# Patient Record
Sex: Male | Born: 1965 | Race: Asian | Hispanic: No | Marital: Married | Smoking: Never smoker
Health system: Southern US, Community
[De-identification: ages and names within clinical notes are randomized; demographics above are authoritative.]

## PROBLEM LIST (undated history)

## (undated) DIAGNOSIS — D61818 Other pancytopenia: Secondary | ICD-10-CM

## (undated) DIAGNOSIS — K7460 Unspecified cirrhosis of liver: Secondary | ICD-10-CM

## (undated) HISTORY — DX: Other pancytopenia: D61.818

---

## 2016-08-27 DIAGNOSIS — K4090 Unilateral inguinal hernia, without obstruction or gangrene, not specified as recurrent: Secondary | ICD-10-CM | POA: Diagnosis not present

## 2016-09-23 ENCOUNTER — Other Ambulatory Visit: Payer: Self-pay | Admitting: Surgery

## 2016-09-23 DIAGNOSIS — K4020 Bilateral inguinal hernia, without obstruction or gangrene, not specified as recurrent: Secondary | ICD-10-CM | POA: Diagnosis not present

## 2016-09-24 NOTE — Patient Instructions (Addendum)
Chad Ramos  09/24/2016   Your procedure is scheduled on: 09-29-16   Report to [facility] Main  Entrance Take [place]  elevators to 3rd floor to Short Stay Center at 5:30 AM.   Call this number if you have problems the morning of surgery [phone]    Remember: ONLY 1 PERSON MAY GO WITH YOU TO SHORT STAY TO GET  READY MORNING OF YOUR SURGERY.  Do not eat food or drink liquids :After Midnight.     Take these medicines the morning of surgery with A SIP OF WATER: None                                You may not have any metal on your body including hair pins and              piercings  Do not wear jewelry, make-up, lotions, powders or perfumes, deodorant             Men may shave face and neck.   Do not bring valuables to the hospital. [facility] IS NOT             RESPONSIBLE   FOR VALUABLES.  Contacts, dentures or bridgework may not be worn into surgery.      Patients discharged the day of surgery will not be allowed to drive home.  Name and phone number of your driver: Chad Ramos [phone]                Please read over the following fact sheets you were given: _____________________________________________________________________             [facility] - Preparing [facility] Before surgery, you can play an important role.  Because skin is not sterile, your skin needs to be as free of germs as possible.  You can reduce the number of germs on your skin by washing with CHG (chlorahexidine gluconate) soap before surgery.  CHG is an antiseptic cleaner which kills germs and bonds with the skin to continue killing germs even after washing. Please DO NOT use if you have an allergy to CHG or antibacterial soaps.  If your skin becomes reddened/irritated stop using the CHG and inform your nurse when you arrive at Short Stay. Do not shave (including legs and underarms) for at least 48 hours prior to the first CHG shower.  You may shave your face/neck. Please  follow these instructions carefully:  1.  Shower with CHG Soap the night before surgery and the  morning of Surgery.  2.  If you choose to wash your hair, wash your hair first as usual with your  normal  shampoo.  3.  After you shampoo, rinse your hair and body thoroughly to remove the  shampoo.                           4.  Use CHG as you would any other liquid soap.  You can apply chg directly  to the skin and wash                       Gently with a scrungie or clean washcloth.  5.  Apply the CHG Soap to your body ONLY FROM THE NECK DOWN.   Do not use on face/  open                           Wound or open sores. Avoid contact with eyes, ears mouth and genitals (private parts).                       Wash face,  Genitals (private parts) with your normal soap.             6.  Wash thoroughly, paying special attention to the area where your surgery  will be performed.  7.  Thoroughly rinse your body with warm water from the neck down.  8.  DO NOT shower/wash with your normal soap after using and rinsing off  the CHG Soap.                9.  Pat yourself dry with a clean towel.            10.  Wear clean pajamas.            11.  Place clean sheets on your bed the night of your first shower and do not  sleep with pets. Day of Surgery : Do not apply any lotions/deodorants the morning of surgery.  Please wear clean clothes to the hospital/surgery center.  FAILURE TO FOLLOW THESE INSTRUCTIONS MAY RESULT IN THE CANCELLATION OF YOUR SURGERY PATIENT SIGNATURE_________________________________  NURSE SIGNATURE__________________________________  ________________________________________________________________________

## 2016-09-25 ENCOUNTER — Encounter (HOSPITAL_COMMUNITY): Payer: Self-pay

## 2016-09-25 ENCOUNTER — Encounter (INDEPENDENT_AMBULATORY_CARE_PROVIDER_SITE_OTHER): Payer: Self-pay

## 2016-09-25 ENCOUNTER — Encounter (HOSPITAL_COMMUNITY)
Admission: RE | Admit: 2016-09-25 | Discharge: 2016-09-25 | Disposition: A | Discharge status: Home / Self Care | Payer: BLUE CROSS/BLUE SHIELD | Source: Ambulatory Visit | Attending: Surgery | Admitting: Surgery

## 2016-09-25 DIAGNOSIS — Z01818 Encounter for other preprocedural examination: Secondary | ICD-10-CM | POA: Insufficient documentation

## 2016-09-25 DIAGNOSIS — K4020 Bilateral inguinal hernia, without obstruction or gangrene, not specified as recurrent: Secondary | ICD-10-CM | POA: Diagnosis not present

## 2016-09-25 LAB — CBC
HEMATOCRIT: 37.9 % — AB (ref 39.0–52.0)
HEMOGLOBIN: 12.9 g/dL — AB (ref 13.0–17.0)
MCH: 31.2 pg (ref 26.0–34.0)
MCHC: 34 g/dL (ref 30.0–36.0)
MCV: 91.8 fL (ref 78.0–100.0)
Platelets: 33 10*3/uL — ABNORMAL LOW (ref 150–400)
RBC: 4.13 MIL/uL — AB (ref 4.22–5.81)
RDW: 13.8 % (ref 11.5–15.5)
WBC: 2.4 10*3/uL — ABNORMAL LOW (ref 4.0–10.5)

## 2016-09-25 NOTE — Progress Notes (Addendum)
09-25-16 CBC result, routed to Dr. Ezzard StandingNewman for review... Also spoke to April, Triage Nurse at Dr. Allene PyoNewman's office to verify that they had received the lab result. April verified that they had received the information,and  that she would review with Dr. Ezzard StandingNewman.

## 2016-09-29 ENCOUNTER — Encounter (HOSPITAL_COMMUNITY): Admission: RE | Payer: Self-pay | Source: Ambulatory Visit

## 2016-09-29 ENCOUNTER — Ambulatory Visit (HOSPITAL_COMMUNITY): Admission: RE | Admit: 2016-09-29 | Payer: BLUE CROSS/BLUE SHIELD | Source: Ambulatory Visit | Admitting: Surgery

## 2016-09-29 SURGERY — REPAIR, HERNIA, INGUINAL, LAPAROSCOPIC
Anesthesia: General | Laterality: Bilateral

## 2016-10-07 ENCOUNTER — Ambulatory Visit (INDEPENDENT_AMBULATORY_CARE_PROVIDER_SITE_OTHER): Payer: BLUE CROSS/BLUE SHIELD | Admitting: Oncology

## 2016-10-07 ENCOUNTER — Encounter: Payer: Self-pay | Admitting: Oncology

## 2016-10-07 ENCOUNTER — Other Ambulatory Visit: Payer: Self-pay | Admitting: Oncology

## 2016-10-07 DIAGNOSIS — K4090 Unilateral inguinal hernia, without obstruction or gangrene, not specified as recurrent: Secondary | ICD-10-CM | POA: Diagnosis not present

## 2016-10-07 DIAGNOSIS — D61818 Other pancytopenia: Secondary | ICD-10-CM | POA: Insufficient documentation

## 2016-10-07 HISTORY — DX: Other pancytopenia: D61.818

## 2016-10-07 LAB — CBC WITH DIFFERENTIAL/PLATELET
Basophils Absolute: 0 10*3/uL (ref 0.0–0.1)
Basophils Relative: 0 %
EOS ABS: 0.1 10*3/uL (ref 0.0–0.7)
EOS PCT: 5 %
HCT: 38.9 % — ABNORMAL LOW (ref 39.0–52.0)
HEMOGLOBIN: 13.2 g/dL (ref 13.0–17.0)
LYMPHS ABS: 0.9 10*3/uL (ref 0.7–4.0)
Lymphocytes Relative: 38 %
MCH: 31.1 pg (ref 26.0–34.0)
MCHC: 33.9 g/dL (ref 30.0–36.0)
MCV: 91.5 fL (ref 78.0–100.0)
MONOS PCT: 8 %
Monocytes Absolute: 0.2 10*3/uL (ref 0.1–1.0)
Neutro Abs: 1.2 10*3/uL — ABNORMAL LOW (ref 1.7–7.7)
Neutrophils Relative %: 49 %
PLATELETS: 34 10*3/uL — AB (ref 150–400)
RBC: 4.25 MIL/uL (ref 4.22–5.81)
RDW: 13.8 % (ref 11.5–15.5)
WBC: 2.4 10*3/uL — ABNORMAL LOW (ref 4.0–10.5)

## 2016-10-07 LAB — LACTATE DEHYDROGENASE: LDH: 207 U/L — ABNORMAL HIGH (ref 98–192)

## 2016-10-07 LAB — SAVE SMEAR

## 2016-10-07 LAB — COMPREHENSIVE METABOLIC PANEL
ALBUMIN: 2.8 g/dL — AB (ref 3.5–5.0)
ALK PHOS: 95 U/L (ref 38–126)
ALT: 40 U/L (ref 17–63)
ANION GAP: 6 (ref 5–15)
AST: 54 U/L — ABNORMAL HIGH (ref 15–41)
BILIRUBIN TOTAL: 1 mg/dL (ref 0.3–1.2)
BUN: 9 mg/dL (ref 6–20)
CALCIUM: 8.5 mg/dL — AB (ref 8.9–10.3)
CO2: 24 mmol/L (ref 22–32)
Chloride: 109 mmol/L (ref 101–111)
Creatinine, Ser: 0.92 mg/dL (ref 0.61–1.24)
GFR calc Af Amer: >60 mL/min (ref >60)
GFR calc non Af Amer: >60 mL/min (ref >60)
GLUCOSE: 138 mg/dL — AB (ref 65–99)
Potassium: 4 mmol/L (ref 3.5–5.1)
Sodium: 139 mmol/L (ref 135–145)
Total Protein: 6.9 g/dL (ref 6.5–8.1)

## 2016-10-07 LAB — RETICULOCYTES
RBC.: 4.25 MIL/uL (ref 4.22–5.81)
RETIC CT PCT: 1.4 % (ref 0.4–3.1)
Retic Count, Absolute: 59.5 10*3/uL (ref 19.0–186.0)

## 2016-10-07 NOTE — Addendum Note (Signed)
Addended by: Bufford Spikes on: 10/07/2016 02:37 PM   Modules accepted: Orders

## 2016-10-07 NOTE — Patient Instructions (Addendum)
Return visit: I will call you after test results available to schedule. We may need to get an X-Ray of your liver and spleen and if this is normal, then we need to do a bone marrow biopsy

## 2016-10-07 NOTE — Progress Notes (Signed)
New Patient Hematology   Chad Ramos [id number] 10-22-1965 51 y.o. 10/07/2016  CC:   Reason for referral: Thrombocytopenia   HPI:  Pleasant 51 year old man from the [place] area of [place] who emigrated to [place] in 1994.  He has lived in [place] primarily with a short stay in [place] and then he moved to [place] 3 years ago.  He presented for evaluation of a left inguinal hernia on September 23, 2016.  As part of that evaluation a CBC was done which shows hemoglobin 12.9, hematocrit 37.9, MCV 92, white count 2400, no differential, platelets 33,000.  He has been in overall excellent health without any major medical or surgical illness.  He is on no chronic medications.  He has had no toxic exposures.  No radiation exposure.  He believes he had malaria when he was 65 or 51 years old.  He attempted to donate blood when he was about 51 years old and was refused due to positive hepatitis serology, type unknown.  Also at about age 71 or 44 when he was studying very hard at the [employer] and not eating well he developed some bleeding gums.  He was told he was anemic.  He received B12 injections and iron.  He never had a blood transfusion. He has no signs or symptoms of a collagen vascular disorder.  He does not bruise easily.  He does not ooze blood if he gets cut.  Mother died at age 40.  Father died at age 43 suddenly while he was on a trip.  Reason not known.  He has 4 brothers and 2 sisters.  He is the youngest.  His oldest sister has expired.  There is no history of blood disorders in the family.  PMH: Past Medical History:  Diagnosis Date  . Pancytopenia ([facility]) 10/07/2016  He denies a history of hypertension, MI, diabetes, ulcers, yellow jaundice, arthritis.  No prior surgery  Allergies: No Known Allergies  Medications: No chronic medications  Social History: Married and his wife accompanies him today.  They have 2 children a boy 21 and a girl 61 both healthy.  He works in Doctor, hospital for [employer].  reports that he has never smoked. He has never used smokeless tobacco. He reports that he does not drink alcohol or use drugs.  Family History: See HPI  Review of Systems: Some recent abdominal bloating.  Discomfort from inguinal hernia. See HPI Remaining ROS negative.  Physical Exam: Blood pressure 123/83, pulse 86, temperature 98 F (36.7 C), height 5' 3"  (1.6 m), weight 129 lb 1.6 oz (58.6 kg), SpO2 100 %. Wt Readings from Last 3 Encounters:  10/07/16 129 lb 1.6 oz (58.6 kg)  09/25/16 129 lb 6 oz (58.7 kg)     General appearance: Thin Asian man HENNT: Pharynx no erythema, exudate, mass, or ulcer. No thyromegaly or thyroid nodules Lymph nodes: No cervical, supraclavicular, or axillary lymphadenopathy Breasts: No gynecomastia Lungs: Clear to auscultation, resonant to percussion throughout Heart: Regular rhythm, no murmur, no gallop, no rub, no click, no edema Abdomen: Soft, nontender, normal bowel sounds, no mass, no organomegaly Extremities: No edema, no calf tenderness Musculoskeletal: no joint deformities GU: Right inguinal hernia.  He is wearing a truss which I did not remove.  No inguinal adenopathy. Vascular: Carotid pulses 2+, no bruits,  Neurologic: Alert, oriented, PERRLA, optic discs sharp and vessels normal, no hemorrhage or exudate, cranial nerves grossly normal, motor strength 5 over 5, reflexes 1+ symmetric, upper body coordination normal, gait  normal, Skin: No rash or ecchymosis.  No spider hemangiomas.  Positive palmar erythema.    Lab Results: Lab Results    white count differential: 49 neutrophils, 38 lymphocytes, 8 monocytes, 5 eosinophils, 0 basophils.  Component Value Date   WBC 2.4 (L) 10/07/2016   HGB 13.2 10/07/2016   HCT 38.9 (L) 10/07/2016   MCV 91.5 10/07/2016   PLT 34 (L) 10/07/2016     Chemistry      Component Value Date/Time   NA 139 10/07/2016 1443   K 4.0 10/07/2016 1443   CL 109 10/07/2016 1443   CO2 24  10/07/2016 1443   BUN 9 10/07/2016 1443   CREATININE 0.92 10/07/2016 1443      Component Value Date/Time   CALCIUM 8.5 (L) 10/07/2016 1443   ALKPHOS 95 10/07/2016 1443   AST 54 (H) 10/07/2016 1443   ALT 40 10/07/2016 1443   BILITOT 1.0 10/07/2016 1443    Reticulocyte 1.4%.  LDH 207.  Total bilirubin 1.0.   Review of peripheral blood film: Normochromic normocytic red cells.  No target cells, spherocytes, schistocytes, or polychromasia.  No inclusions.  Mature neutrophils with normal lobation and granulation.  Mature lymphocytes with occasional benign large granular reactive lymphocyte and a rare plasmacytoid lymphocyte.  Platelets significantly decreased 0-2 per high-power field correlating well with the machine count    Impression: Pancytopenia There is a broad differential.  This could be stigmata of chronic liver disease.  He does not have a palpable liver or spleen on exam.  He has palmar erythema which is nonspecific.  No spider angiomas.  SGOT mildly elevated, LDH borderline elevated at 207 ( lab normal up to 192). and albumin is decreased at 2.8 g; remaining liver functions are normal.  Total protein low normal.  No macrocytosis on smear.  No target cells. 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   Recommendation: Hepatitis and HIV serology pending.  Once these are available, if abnormal, I will get a ultrasound of his liver and spleen.  If nondiagnostic, proceed with bone marrow aspiration and biopsy.  Additional testing based on results.    Murriel Hopper, MD, [facility]  Hematology-Oncology/Internal Medicine  10/07/2016, 3:53 PM

## 2016-10-07 NOTE — Addendum Note (Signed)
Addended by: Bufford Spikes on: 10/07/2016 02:38 PM   Modules accepted: Orders

## 2016-10-08 LAB — HEPATITIS B SURFACE ANTIBODY,QUALITATIVE: Hep B S Ab: NONREACTIVE

## 2016-10-08 LAB — HEPATITIS PANEL, ACUTE
HCV AB: 0.1 {s_co_ratio} (ref 0.0–0.9)
HEP A IGM: NEGATIVE
HEP B C IGM: NEGATIVE
Hepatitis B Surface Ag: POSITIVE — AB

## 2016-10-08 LAB — HIV ANTIBODY (ROUTINE TESTING W REFLEX): HIV Screen 4th Generation wRfx: NONREACTIVE

## 2016-10-09 ENCOUNTER — Other Ambulatory Visit: Payer: Self-pay | Admitting: Oncology

## 2016-10-09 DIAGNOSIS — D61818 Other pancytopenia: Secondary | ICD-10-CM

## 2016-10-16 ENCOUNTER — Ambulatory Visit (HOSPITAL_COMMUNITY)
Admission: RE | Admit: 2016-10-16 | Discharge: 2016-10-16 | Disposition: A | Discharge status: Home / Self Care | Payer: BLUE CROSS/BLUE SHIELD | Source: Ambulatory Visit | Attending: Oncology | Admitting: Oncology

## 2016-10-16 DIAGNOSIS — K766 Portal hypertension: Secondary | ICD-10-CM | POA: Diagnosis not present

## 2016-10-16 DIAGNOSIS — R935 Abnormal findings on diagnostic imaging of other abdominal regions, including retroperitoneum: Secondary | ICD-10-CM | POA: Insufficient documentation

## 2016-10-16 DIAGNOSIS — R93422 Abnormal radiologic findings on diagnostic imaging of left kidney: Secondary | ICD-10-CM | POA: Diagnosis not present

## 2016-10-16 DIAGNOSIS — R188 Other ascites: Secondary | ICD-10-CM | POA: Insufficient documentation

## 2016-10-16 DIAGNOSIS — D61818 Other pancytopenia: Secondary | ICD-10-CM

## 2016-10-16 DIAGNOSIS — R93421 Abnormal radiologic findings on diagnostic imaging of right kidney: Secondary | ICD-10-CM | POA: Diagnosis not present

## 2016-10-16 DIAGNOSIS — K7460 Unspecified cirrhosis of liver: Secondary | ICD-10-CM | POA: Diagnosis not present

## 2016-10-16 DIAGNOSIS — R161 Splenomegaly, not elsewhere classified: Secondary | ICD-10-CM | POA: Diagnosis not present

## 2016-10-16 DIAGNOSIS — I8500 Esophageal varices without bleeding: Secondary | ICD-10-CM | POA: Insufficient documentation

## 2016-10-16 MED ORDER — IOPAMIDOL (ISOVUE-300) INJECTION 61%
INTRAVENOUS | Status: AC
  Filled 2016-10-16: qty 100

## 2016-10-16 MED ORDER — IOPAMIDOL (ISOVUE-300) INJECTION 61%
100.0000 mL | Freq: Once | INTRAVENOUS | Status: AC | PRN
  Administered 2016-10-16: 100 mL via INTRAVENOUS

## 2016-10-21 ENCOUNTER — Telehealth: Payer: Self-pay | Admitting: *Deleted

## 2016-10-21 ENCOUNTER — Other Ambulatory Visit: Payer: Self-pay | Admitting: Oncology

## 2016-10-21 DIAGNOSIS — B1910 Unspecified viral hepatitis B without hepatic coma: Secondary | ICD-10-CM

## 2016-10-21 DIAGNOSIS — R188 Other ascites: Secondary | ICD-10-CM

## 2016-10-21 DIAGNOSIS — B181 Chronic viral hepatitis B without delta-agent: Secondary | ICD-10-CM

## 2016-10-21 DIAGNOSIS — K7460 Unspecified cirrhosis of liver: Secondary | ICD-10-CM

## 2016-10-21 DIAGNOSIS — D61818 Other pancytopenia: Secondary | ICD-10-CM

## 2016-10-21 NOTE — Telephone Encounter (Signed)
Pt called again today. Note: I called him w CT result the day it was done. I discussed need for paracentesis which I will arrange with IR; also discussed w ID, Dr Ninetta LightsHatcher, whose office will contact the pt for an appt to discuss Rx of his Hepatitis B.

## 2016-10-21 NOTE — Telephone Encounter (Signed)
Returned pt's call - requesting test result.

## 2016-10-27 ENCOUNTER — Encounter: Payer: Self-pay | Admitting: Internal Medicine

## 2016-10-27 ENCOUNTER — Ambulatory Visit (INDEPENDENT_AMBULATORY_CARE_PROVIDER_SITE_OTHER): Payer: BLUE CROSS/BLUE SHIELD | Admitting: Internal Medicine

## 2016-10-27 DIAGNOSIS — R188 Other ascites: Secondary | ICD-10-CM

## 2016-10-27 DIAGNOSIS — B1910 Unspecified viral hepatitis B without hepatic coma: Secondary | ICD-10-CM | POA: Insufficient documentation

## 2016-10-27 DIAGNOSIS — B181 Chronic viral hepatitis B without delta-agent: Secondary | ICD-10-CM

## 2016-10-27 DIAGNOSIS — K7460 Unspecified cirrhosis of liver: Secondary | ICD-10-CM | POA: Diagnosis not present

## 2016-10-28 ENCOUNTER — Ambulatory Visit (HOSPITAL_COMMUNITY): Payer: BLUE CROSS/BLUE SHIELD

## 2016-10-28 NOTE — Progress Notes (Signed)
[facility]      Reason for Consult: chronic hepatitis B    Referring Physician: Dr. Cyndie ChimeGranfortuna    Patient ID: Chad PoliGuiyun Ramos, male    DOB: 1965/05/29, 51 y.o.   MRN: [id number]  HPI:   He comes in with a recent diagnosis of advanced cirrhosis and chronic hepatitis B.  He was referred to Dr. Cyndie ChimeGranfortuna for thrombocytopenia and work up revealed cirrhosis.  He is at least Child Pugh B though no INR done but on recent CT with severe portal hypertension and large gastric and esophageal varices and splenomegaly.  He had a remote history of some hematemesis in the 1980s of unknown significance.  He otherwise has had no known medical problems though had not been to the doctor and was being evaluated for an inguinal hernia.  He is originally from [place] and has several family members with liver disease.    Past Medical History:  Diagnosis Date  . Pancytopenia ([facility]) 10/07/2016  cirrhosis Chronic hepatitis B  Prior to Admission medications   Medication Sig Start Date End Date Taking? Authorizing Provider  Multiple Vitamins-Minerals (MULTIVITAMIN WITH MINERALS) tablet Take 1 tablet by mouth daily.   Yes [provider]    No Known Allergies  Social History  Substance Use Topics  . Smoking status: Never Smoker  . Smokeless tobacco: Never Used  . Alcohol use No    FMH: father died in his 7340s of an unknown cause; several siblings with liver disease  Review of Systems  Constitutional: negative for fevers, chills, fatigue, malaise and anorexia Gastrointestinal: negative for nausea and diarrhea Integument/breast: negative for rash Musculoskeletal: negative for myalgias and arthralgias All other systems reviewed and are negative    Constitutional: in no apparent distress and alert  Vitals:   10/27/16 1426  BP: 128/85  Pulse: 79  Temp: (!) 97.5 F (36.4 C)   EYES: anicteric ENMT: no thrush Cardiovascular: Cor RRR Respiratory: CTA b, normal  respiratory effort GI: Bowel sounds are normal, liver is not enlarged, spleen is not enlarged Musculoskeletal: no pedal edema noted Skin: negatives: no rash  Labs: Lab Results  Component Value Date   WBC 2.5 (L) 10/27/2016   HGB 13.7 10/27/2016   HCT 40.4 10/27/2016   MCV 92.0 10/27/2016   PLT 37 (L) 10/27/2016    Lab Results  Component Value Date   CREATININE 0.91 10/27/2016   BUN 14 10/27/2016   NA 138 10/27/2016   K 3.9 10/27/2016   CL 107 10/27/2016   CO2 26 10/27/2016    Lab Results  Component Value Date   ALT 32 10/27/2016   AST 40 (H) 10/27/2016   ALKPHOS 95 10/07/2016   BILITOT 1.0 10/27/2016     Assessment: Advanced cirrhosis from chronic hepatitis B.  I had a long discussion with the patient and his wife of the findings and nature of cirrhosis, lab findings, concerns for long-term prognosis.  I also discussed with them about the varices and importance of EGD evaluation and potential banding.   I also discussed the findings of a hepatitis B surface Ag and likelihood that this is the cause of the liver disease.  I discussed potential treatment options and that there is no cure.  Despite this conversation, the patient and his wife were insistent to just start treatment for chronic hepatitis B with the belief that it will reverse all.  He has refused referral to GI for an EGD and varices management.  I did  discuss that he may in the future need a liver transplant and first should be referred to a hepatologist.    Plan: 1) labs today for hepatitis B 2) referral to [facility] and I will defer treatment of the hepatitis B to them 3) hopefully with further education they can refer him to GI as well.    He will follow up after above.

## 2016-10-29 LAB — CBC
HEMATOCRIT: 40.4 % (ref 38.5–50.0)
HEMOGLOBIN: 13.7 g/dL (ref 13.2–17.1)
MCH: 31.2 pg (ref 27.0–33.0)
MCHC: 33.9 g/dL (ref 32.0–36.0)
MCV: 92 fL (ref 80.0–100.0)
MPV: 12.4 fL (ref 7.5–12.5)
Platelets: 37 10*3/uL — ABNORMAL LOW (ref 140–400)
RBC: 4.39 10*6/uL (ref 4.20–5.80)
RDW: 13.7 % (ref 11.0–15.0)
WBC: 2.5 10*3/uL — AB (ref 3.8–10.8)

## 2016-10-29 LAB — COMPLETE METABOLIC PANEL WITH GFR
AG RATIO: 0.7 (calc) — AB (ref 1.0–2.5)
ALBUMIN MSPROF: 2.9 g/dL — AB (ref 3.6–5.1)
ALT: 32 U/L (ref 9–46)
AST: 40 U/L — ABNORMAL HIGH (ref 10–35)
Alkaline phosphatase (APISO): 99 U/L (ref 40–115)
BUN: 14 mg/dL (ref 7–25)
CALCIUM: 8.1 mg/dL — AB (ref 8.6–10.3)
CO2: 26 mmol/L (ref 20–32)
CREATININE: 0.91 mg/dL (ref 0.70–1.33)
Chloride: 107 mmol/L (ref 98–110)
GFR, EST AFRICAN AMERICAN: 112 mL/min/{1.73_m2} (ref >=60)
GFR, EST NON AFRICAN AMERICAN: 97 mL/min/{1.73_m2} (ref >=60)
GLOBULIN: 4 g/dL — AB (ref 1.9–3.7)
Glucose, Bld: 123 mg/dL — ABNORMAL HIGH (ref 65–99)
Potassium: 3.9 mmol/L (ref 3.5–5.3)
SODIUM: 138 mmol/L (ref 135–146)
Total Bilirubin: 1 mg/dL (ref 0.2–1.2)
Total Protein: 6.9 g/dL (ref 6.1–8.1)

## 2016-10-29 LAB — HEPATITIS A ANTIBODY, TOTAL: Hepatitis A AB,Total: NONREACTIVE

## 2016-10-29 LAB — HIV ANTIBODY (ROUTINE TESTING W REFLEX): HIV: NONREACTIVE

## 2016-10-29 LAB — HEPATITIS B CORE ANTIBODY, TOTAL: Hep B Core Total Ab: REACTIVE — AB

## 2016-10-29 LAB — HEPATITIS B E ANTIBODY: Hep B E Ab: NONREACTIVE

## 2016-10-29 LAB — HEPATITIS C ANTIBODY
HEP C AB: NONREACTIVE
SIGNAL TO CUT-OFF: 0.08 (ref <1.00)

## 2016-10-29 LAB — HEPATITIS B E ANTIGEN: HEP B E AG: NONREACTIVE

## 2016-10-30 LAB — HEPATITIS B DNA, ULTRAQUANTITATIVE, PCR
Hepatitis B DNA (Calc): 5.73 Log IU/mL — ABNORMAL HIGH
Hepatitis B DNA: 538000 IU/mL — ABNORMAL HIGH

## 2016-11-03 DIAGNOSIS — R1111 Vomiting without nausea: Secondary | ICD-10-CM | POA: Diagnosis not present

## 2016-11-03 DIAGNOSIS — R031 Nonspecific low blood-pressure reading: Secondary | ICD-10-CM | POA: Diagnosis not present

## 2016-11-04 ENCOUNTER — Encounter (HOSPITAL_COMMUNITY)
Admission: EM | Disposition: A | Discharge status: Home / Self Care | Payer: Self-pay | Source: Home / Self Care | Attending: Internal Medicine

## 2016-11-04 ENCOUNTER — Inpatient Hospital Stay (HOSPITAL_COMMUNITY)
Admission: EM | Admit: 2016-11-04 | Discharge: 2016-11-13 | DRG: 405 | Disposition: A | Discharge status: Home / Self Care | Payer: BLUE CROSS/BLUE SHIELD | Attending: Emergency Medicine | Admitting: Emergency Medicine

## 2016-11-04 ENCOUNTER — Inpatient Hospital Stay (HOSPITAL_COMMUNITY): Payer: BLUE CROSS/BLUE SHIELD | Admitting: Certified Registered"

## 2016-11-04 ENCOUNTER — Inpatient Hospital Stay (HOSPITAL_COMMUNITY): Payer: BLUE CROSS/BLUE SHIELD | Admitting: Certified Registered Nurse Anesthetist

## 2016-11-04 ENCOUNTER — Encounter (HOSPITAL_COMMUNITY): Payer: Self-pay | Admitting: Emergency Medicine

## 2016-11-04 ENCOUNTER — Inpatient Hospital Stay (HOSPITAL_COMMUNITY): Payer: BLUE CROSS/BLUE SHIELD

## 2016-11-04 DIAGNOSIS — R918 Other nonspecific abnormal finding of lung field: Secondary | ICD-10-CM | POA: Diagnosis not present

## 2016-11-04 DIAGNOSIS — R0902 Hypoxemia: Secondary | ICD-10-CM | POA: Diagnosis not present

## 2016-11-04 DIAGNOSIS — J9602 Acute respiratory failure with hypercapnia: Secondary | ICD-10-CM

## 2016-11-04 DIAGNOSIS — D649 Anemia, unspecified: Secondary | ICD-10-CM | POA: Diagnosis not present

## 2016-11-04 DIAGNOSIS — J9601 Acute respiratory failure with hypoxia: Secondary | ICD-10-CM | POA: Diagnosis not present

## 2016-11-04 DIAGNOSIS — D61818 Other pancytopenia: Secondary | ICD-10-CM | POA: Diagnosis present

## 2016-11-04 DIAGNOSIS — Z66 Do not resuscitate: Secondary | ICD-10-CM | POA: Diagnosis not present

## 2016-11-04 DIAGNOSIS — J9690 Respiratory failure, unspecified, unspecified whether with hypoxia or hypercapnia: Secondary | ICD-10-CM | POA: Diagnosis not present

## 2016-11-04 DIAGNOSIS — I864 Gastric varices: Secondary | ICD-10-CM | POA: Diagnosis present

## 2016-11-04 DIAGNOSIS — E46 Unspecified protein-calorie malnutrition: Secondary | ICD-10-CM | POA: Diagnosis not present

## 2016-11-04 DIAGNOSIS — K920 Hematemesis: Secondary | ICD-10-CM

## 2016-11-04 DIAGNOSIS — Z4589 Encounter for adjustment and management of other implanted devices: Secondary | ICD-10-CM | POA: Diagnosis not present

## 2016-11-04 DIAGNOSIS — K7200 Acute and subacute hepatic failure without coma: Secondary | ICD-10-CM | POA: Diagnosis not present

## 2016-11-04 DIAGNOSIS — J9600 Acute respiratory failure, unspecified whether with hypoxia or hypercapnia: Secondary | ICD-10-CM | POA: Diagnosis not present

## 2016-11-04 DIAGNOSIS — D500 Iron deficiency anemia secondary to blood loss (chronic): Secondary | ICD-10-CM | POA: Diagnosis not present

## 2016-11-04 DIAGNOSIS — Z6820 Body mass index (BMI) 20.0-20.9, adult: Secondary | ICD-10-CM

## 2016-11-04 DIAGNOSIS — J90 Pleural effusion, not elsewhere classified: Secondary | ICD-10-CM | POA: Diagnosis not present

## 2016-11-04 DIAGNOSIS — B181 Chronic viral hepatitis B without delta-agent: Secondary | ICD-10-CM | POA: Diagnosis not present

## 2016-11-04 DIAGNOSIS — K7682 Hepatic encephalopathy: Secondary | ICD-10-CM

## 2016-11-04 DIAGNOSIS — I8501 Esophageal varices with bleeding: Secondary | ICD-10-CM | POA: Diagnosis not present

## 2016-11-04 DIAGNOSIS — D62 Acute posthemorrhagic anemia: Secondary | ICD-10-CM | POA: Diagnosis not present

## 2016-11-04 DIAGNOSIS — E861 Hypovolemia: Secondary | ICD-10-CM | POA: Diagnosis present

## 2016-11-04 DIAGNOSIS — I8511 Secondary esophageal varices with bleeding: Secondary | ICD-10-CM | POA: Diagnosis not present

## 2016-11-04 DIAGNOSIS — J810 Acute pulmonary edema: Secondary | ICD-10-CM | POA: Diagnosis not present

## 2016-11-04 DIAGNOSIS — K7201 Acute and subacute hepatic failure with coma: Secondary | ICD-10-CM | POA: Diagnosis not present

## 2016-11-04 DIAGNOSIS — E872 Acidosis: Secondary | ICD-10-CM | POA: Diagnosis present

## 2016-11-04 DIAGNOSIS — K921 Melena: Secondary | ICD-10-CM | POA: Diagnosis not present

## 2016-11-04 DIAGNOSIS — Z4659 Encounter for fitting and adjustment of other gastrointestinal appliance and device: Secondary | ICD-10-CM

## 2016-11-04 DIAGNOSIS — K766 Portal hypertension: Secondary | ICD-10-CM | POA: Diagnosis not present

## 2016-11-04 DIAGNOSIS — B1910 Unspecified viral hepatitis B without hepatic coma: Secondary | ICD-10-CM | POA: Diagnosis present

## 2016-11-04 DIAGNOSIS — E870 Hyperosmolality and hypernatremia: Secondary | ICD-10-CM | POA: Diagnosis not present

## 2016-11-04 DIAGNOSIS — K922 Gastrointestinal hemorrhage, unspecified: Secondary | ICD-10-CM | POA: Diagnosis not present

## 2016-11-04 DIAGNOSIS — E876 Hypokalemia: Secondary | ICD-10-CM | POA: Diagnosis not present

## 2016-11-04 DIAGNOSIS — I509 Heart failure, unspecified: Secondary | ICD-10-CM | POA: Diagnosis not present

## 2016-11-04 DIAGNOSIS — K7460 Unspecified cirrhosis of liver: Secondary | ICD-10-CM | POA: Diagnosis not present

## 2016-11-04 DIAGNOSIS — Z01818 Encounter for other preprocedural examination: Secondary | ICD-10-CM

## 2016-11-04 DIAGNOSIS — J9811 Atelectasis: Secondary | ICD-10-CM | POA: Diagnosis not present

## 2016-11-04 HISTORY — PX: IR PARACENTESIS: IMG2679

## 2016-11-04 HISTORY — PX: ESOPHAGOGASTRODUODENOSCOPY (EGD) WITH PROPOFOL: SHX5813

## 2016-11-04 HISTORY — PX: IR TIPS: IMG2295

## 2016-11-04 HISTORY — PX: RADIOLOGY WITH ANESTHESIA: SHX6223

## 2016-11-04 HISTORY — DX: Unspecified cirrhosis of liver: K74.60

## 2016-11-04 LAB — CBC
HCT: 30.9 % — ABNORMAL LOW (ref 39.0–52.0)
HCT: 31.8 % — ABNORMAL LOW (ref 39.0–52.0)
HCT: 34.8 % — ABNORMAL LOW (ref 39.0–52.0)
HEMOGLOBIN: 10.8 g/dL — AB (ref 13.0–17.0)
HEMOGLOBIN: 11.9 g/dL — AB (ref 13.0–17.0)
Hemoglobin: 11 g/dL — ABNORMAL LOW (ref 13.0–17.0)
MCH: 31.4 pg (ref 26.0–34.0)
MCH: 29.5 pg (ref 26.0–34.0)
MCH: 29.7 pg (ref 26.0–34.0)
MCHC: 35.6 g/dL (ref 30.0–36.0)
MCHC: 34 g/dL (ref 30.0–36.0)
MCHC: 34.2 g/dL (ref 30.0–36.0)
MCV: 88.3 fL (ref 78.0–100.0)
MCV: 86.9 fL (ref 78.0–100.0)
MCV: 86.8 fL (ref 78.0–100.0)
PLATELETS: 47 10*3/uL — AB (ref 150–400)
PLATELETS: 59 10*3/uL — AB (ref 150–400)
Platelets: 65 10*3/uL — ABNORMAL LOW (ref 150–400)
RBC: 3.5 MIL/uL — AB (ref 4.22–5.81)
RBC: 3.66 MIL/uL — AB (ref 4.22–5.81)
RBC: 4.01 MIL/uL — ABNORMAL LOW (ref 4.22–5.81)
RDW: 14.5 % (ref 11.5–15.5)
RDW: 14.8 % (ref 11.5–15.5)
RDW: 15.2 % (ref 11.5–15.5)
WBC: 5.1 10*3/uL (ref 4.0–10.5)
WBC: 6.8 10*3/uL (ref 4.0–10.5)
WBC: 8.4 10*3/uL (ref 4.0–10.5)

## 2016-11-04 LAB — COMPREHENSIVE METABOLIC PANEL
ALBUMIN: 2.5 g/dL — AB (ref 3.5–5.0)
ALK PHOS: 48 U/L (ref 38–126)
ALT: 24 U/L (ref 17–63)
ALT: 31 U/L (ref 17–63)
ANION GAP: 4 — AB (ref 5–15)
ANION GAP: 5 (ref 5–15)
AST: 32 U/L (ref 15–41)
AST: 50 U/L — AB (ref 15–41)
Albumin: 1.7 g/dL — ABNORMAL LOW (ref 3.5–5.0)
Alkaline Phosphatase: 42 U/L (ref 38–126)
BILIRUBIN TOTAL: 5.1 mg/dL — AB (ref 0.3–1.2)
BUN: 18 mg/dL (ref 6–20)
BUN: 17 mg/dL (ref 6–20)
CALCIUM: 7.3 mg/dL — AB (ref 8.9–10.3)
CHLORIDE: 115 mmol/L — AB (ref 101–111)
CO2: 23 mmol/L (ref 22–32)
CO2: 19 mmol/L — ABNORMAL LOW (ref 22–32)
Calcium: 7.1 mg/dL — ABNORMAL LOW (ref 8.9–10.3)
Chloride: 112 mmol/L — ABNORMAL HIGH (ref 101–111)
Creatinine, Ser: 0.95 mg/dL (ref 0.61–1.24)
Creatinine, Ser: 0.83 mg/dL (ref 0.61–1.24)
GFR calc Af Amer: >60 mL/min (ref >60)
GFR calc non Af Amer: >60 mL/min (ref >60)
GFR calc non Af Amer: >60 mL/min (ref >60)
GLUCOSE: 145 mg/dL — AB (ref 65–99)
Glucose, Bld: 140 mg/dL — ABNORMAL HIGH (ref 65–99)
POTASSIUM: 4.4 mmol/L (ref 3.5–5.1)
POTASSIUM: 3.6 mmol/L (ref 3.5–5.1)
SODIUM: 139 mmol/L (ref 135–145)
Sodium: 139 mmol/L (ref 135–145)
TOTAL PROTEIN: 3.9 g/dL — AB (ref 6.5–8.1)
Total Bilirubin: 0.9 mg/dL (ref 0.3–1.2)
Total Protein: 4.3 g/dL — ABNORMAL LOW (ref 6.5–8.1)

## 2016-11-04 LAB — CBC WITH DIFFERENTIAL/PLATELET
BASOS PCT: 0 %
Basophils Absolute: 0 10*3/uL (ref 0.0–0.1)
EOS ABS: 0.1 10*3/uL (ref 0.0–0.7)
Eosinophils Relative: 1 %
HCT: 23 % — ABNORMAL LOW (ref 39.0–52.0)
HEMOGLOBIN: 7.8 g/dL — AB (ref 13.0–17.0)
Lymphocytes Relative: 23 %
Lymphs Abs: 1.1 10*3/uL (ref 0.7–4.0)
MCH: 31.6 pg (ref 26.0–34.0)
MCHC: 33.9 g/dL (ref 30.0–36.0)
MCV: 93.1 fL (ref 78.0–100.0)
MONO ABS: 0.4 10*3/uL (ref 0.1–1.0)
MONOS PCT: 7 %
NEUTROS PCT: 69 %
Neutro Abs: 3.4 10*3/uL (ref 1.7–7.7)
Platelets: 35 10*3/uL — ABNORMAL LOW (ref 150–400)
RBC: 2.47 MIL/uL — ABNORMAL LOW (ref 4.22–5.81)
RDW: 14.3 % (ref 11.5–15.5)
WBC: 4.9 10*3/uL (ref 4.0–10.5)

## 2016-11-04 LAB — POCT I-STAT, CHEM 8
BUN: 21 mg/dL — ABNORMAL HIGH (ref 6–20)
CALCIUM ION: 1.02 mmol/L — AB (ref 1.15–1.40)
CHLORIDE: 112 mmol/L — AB (ref 101–111)
Creatinine, Ser: 0.8 mg/dL (ref 0.61–1.24)
GLUCOSE: 96 mg/dL (ref 65–99)
HEMATOCRIT: 15 % — AB (ref 39.0–52.0)
Hemoglobin: 5.1 g/dL — CL (ref 13.0–17.0)
Potassium: 4.8 mmol/L (ref 3.5–5.1)
SODIUM: 142 mmol/L (ref 135–145)
TCO2: 21 mmol/L — AB (ref 22–32)

## 2016-11-04 LAB — BLOOD GAS, ARTERIAL
Acid-base deficit: 7.7 mmol/L — ABNORMAL HIGH (ref 0.0–2.0)
Bicarbonate: 19.5 mmol/L — ABNORMAL LOW (ref 20.0–28.0)
Drawn by: 51191
FIO2: 100
LHR: 15 {breaths}/min
O2 SAT: 99.2 %
PATIENT TEMPERATURE: 97
PCO2 ART: 53.6 mmHg — AB (ref 32.0–48.0)
PEEP/CPAP: 5 cmH2O
PO2 ART: 349 mmHg — AB (ref 83.0–108.0)
VT: 460 mL
pH, Arterial: 7.18 — CL (ref 7.350–7.450)

## 2016-11-04 LAB — GLUCOSE, CAPILLARY
GLUCOSE-CAPILLARY: 155 mg/dL — AB (ref 65–99)
Glucose-Capillary: 132 mg/dL — ABNORMAL HIGH (ref 65–99)

## 2016-11-04 LAB — URINALYSIS, ROUTINE W REFLEX MICROSCOPIC
BACTERIA UA: NONE SEEN
BILIRUBIN URINE: NEGATIVE
Glucose, UA: NEGATIVE mg/dL
KETONES UR: NEGATIVE mg/dL
LEUKOCYTES UA: NEGATIVE
NITRITE: NEGATIVE
PH: 5 (ref 5.0–8.0)
Protein, ur: NEGATIVE mg/dL
SPECIFIC GRAVITY, URINE: 1.035 — AB (ref 1.005–1.030)

## 2016-11-04 LAB — DIC (DISSEMINATED INTRAVASCULAR COAGULATION) PANEL: INR: 2.07

## 2016-11-04 LAB — PREPARE RBC (CROSSMATCH)

## 2016-11-04 LAB — PROTIME-INR
INR: 2.32
PROTHROMBIN TIME: 25.3 s — AB (ref 11.4–15.2)

## 2016-11-04 LAB — AMMONIA: AMMONIA: 165 umol/L — AB (ref 9–35)

## 2016-11-04 LAB — I-STAT CHEM 8, ED
BUN: 18 mg/dL (ref 6–20)
CREATININE: 0.9 mg/dL (ref 0.61–1.24)
Calcium, Ion: 1.02 mmol/L — ABNORMAL LOW (ref 1.15–1.40)
Chloride: 111 mmol/L (ref 101–111)
Glucose, Bld: 132 mg/dL — ABNORMAL HIGH (ref 65–99)
HEMATOCRIT: 20 % — AB (ref 39.0–52.0)
HEMOGLOBIN: 6.8 g/dL — AB (ref 13.0–17.0)
Potassium: 4.4 mmol/L (ref 3.5–5.1)
SODIUM: 142 mmol/L (ref 135–145)
TCO2: 21 mmol/L — ABNORMAL LOW (ref 22–32)

## 2016-11-04 LAB — LIPASE, BLOOD: Lipase: 27 U/L (ref 11–51)

## 2016-11-04 LAB — DIC (DISSEMINATED INTRAVASCULAR COAGULATION)PANEL
D-Dimer, Quant: 3.11 ug/mL-FEU — ABNORMAL HIGH (ref 0.00–0.50)
Fibrinogen: 99 mg/dL — CL (ref 210–475)
Platelets: 47 10*3/uL — ABNORMAL LOW (ref 150–400)
Prothrombin Time: 23.2 seconds — ABNORMAL HIGH (ref 11.4–15.2)
Smear Review: NONE SEEN
aPTT: 39 seconds — ABNORMAL HIGH (ref 24–36)

## 2016-11-04 LAB — TRIGLYCERIDES: Triglycerides: 106 mg/dL (ref <150)

## 2016-11-04 LAB — LACTIC ACID, PLASMA: LACTIC ACID, VENOUS: 3.6 mmol/L — AB (ref 0.5–1.9)

## 2016-11-04 LAB — ABO/RH: ABO/RH(D): B POS

## 2016-11-04 SURGERY — RADIOLOGY WITH ANESTHESIA
Anesthesia: Monitor Anesthesia Care

## 2016-11-04 SURGERY — ESOPHAGOGASTRODUODENOSCOPY (EGD) WITH PROPOFOL
Anesthesia: Monitor Anesthesia Care

## 2016-11-04 MED ORDER — DEXTROSE 5 % IV SOLN
INTRAVENOUS | Status: DC | PRN
  Administered 2016-11-04: 50 ug/min via INTRAVENOUS

## 2016-11-04 MED ORDER — PANTOPRAZOLE SODIUM 40 MG IV SOLR: 40.0000 mg | Freq: Two times a day (BID) | INTRAVENOUS | Status: DC

## 2016-11-04 MED ORDER — PROPOFOL 1000 MG/100ML IV EMUL
0.0000 ug/kg/min | INTRAVENOUS | Status: DC
  Filled 2016-11-04: qty 100

## 2016-11-04 MED ORDER — SODIUM CHLORIDE 0.9 % IV SOLN: 250.0000 mL | INTRAVENOUS | Status: DC | PRN

## 2016-11-04 MED ORDER — IOPAMIDOL (ISOVUE-300) INJECTION 61%
INTRAVENOUS | Status: AC
  Administered 2016-11-04: 100 mL
  Filled 2016-11-04: qty 100

## 2016-11-04 MED ORDER — IOPAMIDOL (ISOVUE-300) INJECTION 61%
INTRAVENOUS | Status: AC
  Filled 2016-11-04: qty 100

## 2016-11-04 MED ORDER — SUCCINYLCHOLINE CHLORIDE 200 MG/10ML IV SOSY
PREFILLED_SYRINGE | INTRAVENOUS | Status: DC | PRN
  Administered 2016-11-04: 140 mg via INTRAVENOUS

## 2016-11-04 MED ORDER — DEXTROSE 5 % IV SOLN
1.0000 g | Freq: Every day | INTRAVENOUS | Status: DC
  Administered 2016-11-05 – 2016-11-13 (×9): 1 g via INTRAVENOUS
  Filled 2016-11-04 (×9): qty 10

## 2016-11-04 MED ORDER — IOPAMIDOL (ISOVUE-300) INJECTION 61%
INTRAVENOUS | Status: AC
  Filled 2016-11-04: qty 150

## 2016-11-04 MED ORDER — CEFAZOLIN SODIUM-DEXTROSE 2-4 GM/100ML-% IV SOLN
2.0000 g | Freq: Once | INTRAVENOUS | Status: AC
  Administered 2016-11-04: 2 g via INTRAVENOUS

## 2016-11-04 MED ORDER — CEFAZOLIN SODIUM-DEXTROSE 2-4 GM/100ML-% IV SOLN
INTRAVENOUS | Status: AC
  Filled 2016-11-04: qty 100

## 2016-11-04 MED ORDER — ALBUMIN HUMAN 5 % IV SOLN
INTRAVENOUS | Status: DC | PRN
  Administered 2016-11-04 (×2): via INTRAVENOUS

## 2016-11-04 MED ORDER — LIDOCAINE HCL (CARDIAC) 20 MG/ML IV SOLN
INTRAVENOUS | Status: DC | PRN
  Administered 2016-11-04: 60 mg via INTRAVENOUS

## 2016-11-04 MED ORDER — CHLORHEXIDINE GLUCONATE 0.12% ORAL RINSE (MEDLINE KIT)
15.0000 mL | Freq: Two times a day (BID) | OROMUCOSAL | Status: DC
  Administered 2016-11-04 – 2016-11-07 (×4): 15 mL via OROMUCOSAL

## 2016-11-04 MED ORDER — PANTOPRAZOLE SODIUM 40 MG IV SOLR: 40.0000 mg | Freq: Once | INTRAVENOUS | Status: DC

## 2016-11-04 MED ORDER — FENTANYL CITRATE (PF) 100 MCG/2ML IJ SOLN
25.0000 ug | INTRAMUSCULAR | Status: DC | PRN
  Administered 2016-11-05: 50 ug via INTRAVENOUS
  Filled 2016-11-04 (×2): qty 2

## 2016-11-04 MED ORDER — FENTANYL CITRATE (PF) 100 MCG/2ML IJ SOLN: 100.0000 ug | INTRAMUSCULAR | Status: DC | PRN

## 2016-11-04 MED ORDER — OCTREOTIDE LOAD VIA INFUSION
50.0000 ug | Freq: Once | INTRAVENOUS | Status: AC
  Administered 2016-11-04: 50 ug via INTRAVENOUS
  Filled 2016-11-04: qty 25

## 2016-11-04 MED ORDER — SODIUM CHLORIDE 0.9 % IV BOLUS (SEPSIS)
1000.0000 mL | Freq: Once | INTRAVENOUS | Status: AC
  Administered 2016-11-04: 1000 mL via INTRAVENOUS

## 2016-11-04 MED ORDER — ROCURONIUM BROMIDE 100 MG/10ML IV SOLN
INTRAVENOUS | Status: DC | PRN
  Administered 2016-11-04 (×3): 50 mg via INTRAVENOUS

## 2016-11-04 MED ORDER — SODIUM CHLORIDE 0.9 % IV SOLN
50.0000 ug/h | INTRAVENOUS | Status: DC
  Administered 2016-11-04 – 2016-11-08 (×10): 50 ug/h via INTRAVENOUS
  Filled 2016-11-04 (×25): qty 1

## 2016-11-04 MED ORDER — FENTANYL CITRATE (PF) 100 MCG/2ML IJ SOLN
25.0000 ug | INTRAMUSCULAR | Status: DC | PRN
  Administered 2016-11-04: 100 ug via INTRAVENOUS
  Administered 2016-11-05 – 2016-11-06 (×3): 50 ug via INTRAVENOUS
  Filled 2016-11-04 (×3): qty 2

## 2016-11-04 MED ORDER — FENTANYL CITRATE (PF) 100 MCG/2ML IJ SOLN
INTRAMUSCULAR | Status: DC | PRN
  Administered 2016-11-04 (×2): 50 ug via INTRAVENOUS

## 2016-11-04 MED ORDER — ROCURONIUM BROMIDE 100 MG/10ML IV SOLN
INTRAVENOUS | Status: DC | PRN
  Administered 2016-11-04: 40 mg via INTRAVENOUS

## 2016-11-04 MED ORDER — SODIUM CHLORIDE 0.9 % IV SOLN
10.0000 mL/h | Freq: Once | INTRAVENOUS | Status: AC
Start: 2016-11-04 — End: 2016-11-04
  Administered 2016-11-04: 10 mL/h via INTRAVENOUS

## 2016-11-04 MED ORDER — SODIUM CHLORIDE 0.9 % IV SOLN
8.0000 mg/h | INTRAVENOUS | Status: DC
  Administered 2016-11-04: 15:00:00 via INTRAVENOUS
  Administered 2016-11-04 – 2016-11-05 (×2): 8 mg/h via INTRAVENOUS
  Filled 2016-11-04 (×6): qty 80

## 2016-11-04 MED ORDER — GELATIN ABSORBABLE 12-7 MM EX MISC
CUTANEOUS | Status: AC
  Filled 2016-11-04: qty 1

## 2016-11-04 MED ORDER — SODIUM CHLORIDE 0.9 % IV SOLN
80.0000 mg | Freq: Once | INTRAVENOUS | Status: AC
  Administered 2016-11-04: 80 mg via INTRAVENOUS
  Filled 2016-11-04: qty 80

## 2016-11-04 MED ORDER — ONDANSETRON HCL 4 MG/2ML IJ SOLN
4.0000 mg | Freq: Once | INTRAMUSCULAR | Status: AC
  Administered 2016-11-04: 4 mg via INTRAVENOUS
  Filled 2016-11-04: qty 2

## 2016-11-04 MED ORDER — ONDANSETRON HCL 4 MG/2ML IJ SOLN
4.0000 mg | Freq: Four times a day (QID) | INTRAMUSCULAR | Status: DC | PRN
  Administered 2016-11-05: 4 mg via INTRAVENOUS
  Filled 2016-11-04: qty 2

## 2016-11-04 MED ORDER — ONDANSETRON HCL 4 MG PO TABS: 4.0000 mg | ORAL_TABLET | Freq: Four times a day (QID) | ORAL | Status: DC | PRN

## 2016-11-04 MED ORDER — SODIUM CHLORIDE 0.9 % IV SOLN
INTRAVENOUS | Status: DC
  Administered 2016-11-04 (×3): via INTRAVENOUS

## 2016-11-04 MED ORDER — PROPOFOL 10 MG/ML IV BOLUS
INTRAVENOUS | Status: DC | PRN
  Administered 2016-11-04: 130 mg via INTRAVENOUS

## 2016-11-04 MED ORDER — SODIUM CHLORIDE 0.9 % IV SOLN: Freq: Once | INTRAVENOUS | Status: DC

## 2016-11-04 MED ORDER — DEXTROSE 5 % IV SOLN
1.0000 g | Freq: Once | INTRAVENOUS | Status: AC
  Administered 2016-11-04: 1 g via INTRAVENOUS
  Filled 2016-11-04: qty 10

## 2016-11-04 MED ORDER — PHENYLEPHRINE HCL 10 MG/ML IJ SOLN
INTRAMUSCULAR | Status: DC | PRN
  Administered 2016-11-04: 30 ug/min via INTRAVENOUS

## 2016-11-04 MED ORDER — PROPOFOL 500 MG/50ML IV EMUL
INTRAVENOUS | Status: DC | PRN
  Administered 2016-11-04: 50 ug/kg/min via INTRAVENOUS

## 2016-11-04 MED ORDER — CALCIUM CHLORIDE 10 % IV SOLN
INTRAVENOUS | Status: DC | PRN
  Administered 2016-11-04: 200 mg via INTRAVENOUS
  Administered 2016-11-04: 100 mg via INTRAVENOUS

## 2016-11-04 MED ORDER — SODIUM CHLORIDE 0.9 % IV SOLN
Freq: Once | INTRAVENOUS | Status: AC
  Administered 2016-11-04: 10 mL/h via INTRAVENOUS

## 2016-11-04 MED ORDER — ORAL CARE MOUTH RINSE
15.0000 mL | OROMUCOSAL | Status: DC
  Administered 2016-11-04 – 2016-11-07 (×14): 15 mL via OROMUCOSAL

## 2016-11-04 SURGICAL SUPPLY — 14 items

## 2016-11-04 NOTE — Sedation Documentation (Addendum)
Critical fibrinogen level of 99 reported to Dr Loreta Ave and Dr Deanne Coffer

## 2016-11-04 NOTE — Progress Notes (Signed)
eLink Physician-Brief Progress Note Patient Name: Chad Ramos DOB: 1965/10/28 MRN: [id number]   Date of Service  11/04/2016  HPI/Events of Note  Lactic Acid level = 3.6. No CVL or CVP. No LVEF.   eICU Interventions  Will bolus with 0.9 NaCl 1 liter IV over 1 hour now.      Intervention Category Major Interventions: Acid-Base disturbance - evaluation and management  Sommer,Steven Eugene 11/04/2016, 10:12 PM

## 2016-11-04 NOTE — ED Notes (Signed)
Wife Cordelia Pen, phone #[phone]

## 2016-11-04 NOTE — ED Provider Notes (Signed)
[facility]-EMERGENCY DEPT Provider Note   CSN: [id number] Arrival date & time: 11/04/16  0031     History   Chief Complaint Chief Complaint  Patient presents with  . Hematemesis    HPI Cristino Degroff is a 51 y.o. male.  HPI  This a 51 year old male who presents with hematemesis. Patient with history of chronic cirrhosis secondary to hepatitis B and pancytopenia reports onset of vomiting bright red blood at 8 PM. No history of the same. He denies any alcohol use or recent NSAID use. He reports some lower abdominal pain but no epigastric pain. Has history of hernia repair. Denies any dark tarry stools. Patient immigrated from [place] to [place] in the early 90s and just recently moved to [place] within the last 3 years. He was found to have pancytopenia by his primary physician and worked up to find chronic cirrhosis likely secondary to hepatitis be infection. No endoscopies on file. Patient reports some dizziness. No chest pain or shortness of breath. Denies fevers.  Past Medical History:  Diagnosis Date  . Cirrhosis ([facility])   . Pancytopenia ([facility]) 10/07/2016    Patient Active Problem List   Diagnosis Date Noted  . Acute blood loss anemia 11/04/2016  . UGIB (upper gastrointestinal bleed) 11/04/2016  . Chronic viral hepatitis B without delta-agent ([facility]) 10/27/2016  . Cirrhosis ([facility]) 10/27/2016  . Pancytopenia ([facility]) 10/07/2016    History reviewed. No pertinent surgical history.     Home Medications    Prior to Admission medications   Medication Sig Start Date End Date Taking? Authorizing Provider  Multiple Vitamins-Minerals (MULTIVITAMIN WITH MINERALS) tablet Take 1 tablet by mouth daily.   Yes [provider]    Family History No family history on file.  Social History Social History  Substance Use Topics  . Smoking status: Never Smoker  . Smokeless tobacco: Never Used  . Alcohol use No     Allergies   Patient has no known allergies.   Review of  Systems Review of Systems  Constitutional: Negative for fever.  Respiratory: Negative for shortness of breath.   Cardiovascular: Negative for chest pain.  Gastrointestinal: Positive for nausea and vomiting. Negative for abdominal pain, blood in stool and constipation.       Hematemesis  All other systems reviewed and are negative.    Physical Exam Updated Vital Signs BP (!) 95/57   Pulse 85   Resp 17   SpO2 99%   Physical Exam  Constitutional: He is oriented to person, place, and time.  Pale, ill-appearing, no acute distress  HENT:  Head: Normocephalic and atraumatic.  Dry blood about the mouth  Cardiovascular: Normal rate, regular rhythm and normal heart sounds.   No murmur heard. Pulmonary/Chest: Effort normal and breath sounds normal. No respiratory distress. He has no wheezes.  Abdominal: Soft. Bowel sounds are normal. There is no tenderness. There is no rebound and no guarding.  Musculoskeletal:  1+ radial pulse bilaterally  Neurological: He is alert and oriented to person, place, and time.  Skin: Skin is warm and dry.  Psychiatric: He has a normal mood and affect.  Nursing note and vitals reviewed.    ED Treatments / Results  Labs (all labs ordered are listed, but only abnormal results are displayed) Labs Reviewed  CBC WITH DIFFERENTIAL/PLATELET - Abnormal; Notable for the following:       Result Value   RBC 2.47 (*)    Hemoglobin 7.8 (*)    HCT 23.0 (*)    Platelets 35 (*)  All other components within normal limits  COMPREHENSIVE METABOLIC PANEL - Abnormal; Notable for the following:    Chloride 112 (*)    Glucose, Bld 140 (*)    Calcium 7.3 (*)    Total Protein 3.9 (*)    Albumin 1.7 (*)    Anion gap 4 (*)    All other components within normal limits  PROTIME-INR - Abnormal; Notable for the following:    Prothrombin Time 25.3 (*)    All other components within normal limits  I-STAT CHEM 8, ED - Abnormal; Notable for the following:    Glucose, Bld  132 (*)    Calcium, Ion 1.02 (*)    TCO2 21 (*)    Hemoglobin 6.8 (*)    HCT 20.0 (*)    All other components within normal limits  LIPASE, BLOOD  OCCULT BLOOD GASTRIC / DUODENUM (SPECIMEN CUP)  HEMOGLOBIN AND HEMATOCRIT, BLOOD  TYPE AND SCREEN  PREPARE RBC (CROSSMATCH)  ABO/RH    EKG  EKG Interpretation None       Radiology No results found.  Procedures Procedures (including critical care time)  Medications Ordered in ED Medications  octreotide (SANDOSTATIN) 2 mcg/mL load via infusion 50 mcg (50 mcg Intravenous Bolus from Bag 11/04/16 0200)    And  octreotide (SANDOSTATIN) 500 mcg in sodium chloride 0.9 % 250 mL (2 mcg/mL) infusion (50 mcg/hr Intravenous New Bag/Given 11/04/16 0200)  pantoprazole (PROTONIX) 80 mg in sodium chloride 0.9 % 250 mL (0.32 mg/mL) infusion (8 mg/hr Intravenous New Bag/Given 11/04/16 0220)  pantoprazole (PROTONIX) injection 40 mg (not administered)  cefTRIAXone (ROCEPHIN) 1 g in dextrose 5 % 50 mL IVPB (not administered)  ondansetron (ZOFRAN) tablet 4 mg (not administered)    Or  ondansetron (ZOFRAN) injection 4 mg (not administered)  sodium chloride 0.9 % bolus 1,000 mL (0 mLs Intravenous Stopped 11/04/16 0150)  sodium chloride 0.9 % bolus 1,000 mL (0 mLs Intravenous Stopped 11/04/16 0221)  pantoprazole (PROTONIX) 80 mg in sodium chloride 0.9 % 100 mL IVPB (0 mg Intravenous Stopped 11/04/16 0220)  ondansetron (ZOFRAN) injection 4 mg (4 mg Intravenous Given 11/04/16 0220)  0.9 %  sodium chloride infusion (10 mL/hr Intravenous New Bag/Given 11/04/16 0221)  cefTRIAXone (ROCEPHIN) 1 g in dextrose 5 % 50 mL IVPB (0 g Intravenous Stopped 11/04/16 0409)     Initial Impression / Assessment and Plan / ED Course  I have reviewed the triage vital signs and the nursing notes.  Pertinent labs & imaging results that were available during my care of the patient were reviewed by me and considered in my medical decision making (see chart for details).      Patient presents with hematemesis. History of cirrhosis. He is overall ill appearing but nontoxic. Blood pressure 90 systolic. Not tachycardic. No tenderness on exam. Given history, would be concerned for variceal bleed. Patient was typed and screened. He has 2 large-bore IVs. Initial chem 8 shows a hemoglobin of 6.8. Last hemoglobin normal. Patient was typed and screened for 2 units to be transfused. He was also started on Protonix and octreotide drips. Patient was discussed with Dr. Matthias Hughs, gastroenterology.  Given history of cirrhosis and ascites, will add Rocephin. Given that the patient has been fluid responsive and has not had any additional hematemesis while in the ED, will plan for EGD first thing in the morning unless patient status changes. This was discussed with the admitting hospitalist.  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

## 2016-11-04 NOTE — Transfer of Care (Signed)
Immediate Anesthesia Transfer of Care Note  Patient: Chad Ramos  Procedure(s) Performed: Procedure(s): RADIOLOGY WITH ANESTHESIA (N/A)  Patient Location: ICU  Anesthesia Type:General  Level of Consciousness: Patient remains intubated per anesthesia plan  Airway & Oxygen Therapy: Patient remains intubated per anesthesia plan and Patient placed on Ventilator (see vital sign flow sheet for setting)  Post-op Assessment: Report given to RN and Post -op Vital signs reviewed and stable  Post vital signs: Reviewed and stable  Last Vitals:  Vitals:   11/04/16 1535 11/04/16 1550  BP: (!) 131/92 120/85  Pulse: 73 76  Resp: 18 20  Temp:    SpO2: 100% 100%    Last Pain:  Vitals:   11/04/16 1500  TempSrc: Oral  PainSc:          Complications: No apparent anesthesia complications

## 2016-11-04 NOTE — Consult Note (Addendum)
Referring Provider: Dr. Wilkie Aye Primary Care Physician:  Patient, No Pcp Per Primary Gastroenterologist:  Gentry Fitz  Reason for Consultation:  Upper GI bleed  HPI: Chad Ramos is a 51 y.o. 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 Patient was doing fine until yesterday when he started having vomiting which contained bright blood. He had 3 episodes of hematemesis. He also had melena with last bowel movement this morning. He was complaining of abdominal distention which is resolved now. Denied previous bleeding episode. Patient is complaining of weakness. He denied any chest pain and shortness of breath. Denied dysphagia or odynophagia.   No previous EGD or colonoscopy. His brother was also diagnosed with chronic hepatitis B. No family history of colon cancer.  Past Medical History:  Diagnosis Date  . Cirrhosis (HCC)   . Pancytopenia (HCC) 10/07/2016    History reviewed. No pertinent surgical history.  Prior to Admission medications   Medication Sig Start Date End Date Taking? Authorizing Provider  Multiple Vitamins-Minerals (MULTIVITAMIN WITH MINERALS) tablet Take 1 tablet by mouth daily.   Yes [provider]    Scheduled Meds: . [START ON 11/07/2016] pantoprazole  40 mg Intravenous Q12H   Continuous Infusions: . sodium chloride    . cefTRIAXone (ROCEPHIN)  IV    . octreotide  (SANDOSTATIN)    IV infusion 50 mcg/hr (11/04/16 0200)  . pantoprozole (PROTONIX) infusion 8 mg/hr (11/04/16 0220)   PRN Meds:.ondansetron **OR** ondansetron (ZOFRAN) IV  Allergies as of 11/04/2016  . (No Known Allergies)    No family history on file.  Social History   Social History  . Marital status: Married    Spouse name: N/A  . Number of children: N/A  . Years of education: N/A   Occupational History  . Not on file.   Social History Main Topics  . Smoking status: Never Smoker  . Smokeless  tobacco: Never Used  . Alcohol use No  . Drug use: No  . Sexual activity: Not on file   Other Topics Concern  . Not on file   Social History Narrative  . No narrative on file    Review of Systems: Review of Systems  Constitutional: Negative for chills and fever.  HENT: Negative for hearing loss and tinnitus.   Eyes: Negative for blurred vision and double vision.  Respiratory: Negative for cough, hemoptysis and sputum production.   Cardiovascular: Negative for chest pain and palpitations.  Gastrointestinal: Positive for melena, nausea and vomiting. Negative for heartburn.  Genitourinary: Negative for dysuria and urgency.  Musculoskeletal: Negative for myalgias and neck pain.  Skin: Negative for itching and rash.  Neurological: Negative for seizures and loss of consciousness.  Endo/Heme/Allergies: Does not bruise/bleed easily.  Psychiatric/Behavioral: Negative for hallucinations and suicidal ideas.    Physical Exam: Vital signs: Vitals:   11/04/16 0645 11/04/16 0700  BP: 100/72 105/61  Pulse: 88 91  Resp: 13 17  SpO2: 98% 99%   Physical Exam  Constitutional: He is oriented to person, place, and time. He appears well-developed and well-nourished. No distress.  HENT:  Head: Normocephalic and atraumatic.  Mouth/Throat: No oropharyngeal exudate.  Eyes: EOM are normal. No scleral icterus.  Neck: Normal range of motion. Neck supple. No thyromegaly present.  Cardiovascular: Normal rate, regular rhythm and normal heart sounds.   Pulmonary/Chest: Effort normal and breath sounds normal. No respiratory distress.  Abdominal: Soft. Bowel sounds are normal. He exhibits distension. There is no  tenderness. There is no rebound and no guarding.  Musculoskeletal: Normal range of motion. He exhibits no edema.  Neurological: He is alert and oriented to person, place, and time.  Skin: Skin is warm. No erythema.  Psychiatric: He has a normal mood and affect. His behavior is normal.  Vitals  reviewed.   GI:  Lab Results:  Recent Labs  11/04/16 0143 11/04/16 0150  WBC 4.9  --   HGB 7.8* 6.8*  HCT 23.0* 20.0*  PLT 35*  --    BMET  Recent Labs  11/04/16 0143 11/04/16 0150  NA 139 142  K 4.4 4.4  CL 112* 111  CO2 23  --   GLUCOSE 140* 132*  BUN 18 18  CREATININE 0.95 0.90  CALCIUM 7.3*  --    LFT  Recent Labs  11/04/16 0143  PROT 3.9*  ALBUMIN 1.7*  AST 32  ALT 24  ALKPHOS 48  BILITOT 0.9   PT/INR  Recent Labs  11/04/16 0143  LABPROT 25.3*  INR 2.32     Studies/Results: No results found.  Impression/Plan: - Hematemesis and melena in patient with recently diagnosed cirrhosis. Most likely variceal bleed. - Cirrhosis from chronic hepatitis B. MELD score 16  - Chroinc hepatitis B. No previous treatment. He was seen by infectious disease earlier this month. He has been referred to liver care for further management. - Abnormal CT scan 10/16/2016  Showing widespread peritoneal thickening and enhancement which may suggest peritonitis or malignancy  Recommendations ------------------------- - Transfuse 2 units of platelets. - Continue octreotide, antibiotics and PPI. - EGD today for possible band ligation. Procedure including band ligation, risks benefits and alternatives discussed with the patient and wife. They verbalized understanding. Risk includes but not limited to infection, bleeding, perforation and complication from sedation. - Monitor H&H. Avoid over transfusion.  - Paracentesis once acute issues are resolved - GI will follow    LOS: 0 days   Kathi Der  MD, FACP 11/04/2016, 8:37 AM  Pager [phone] If no answer or after 5 PM call [phone]

## 2016-11-04 NOTE — Op Note (Signed)
[facility] Patient Name: Chad Ramos Procedure Date : 11/04/2016 MRN: [id number] Attending MD: Kathi Der , MD Date of Birth: 08-20-1965 CSN: [id number] Age: 51 Admit Type: Outpatient Procedure:                Upper GI endoscopy Indications:              Hematemesis Providers:                Kathi Der, MD, Norman Clay, RN, Kandice Robinsons, Technician Referring MD:              Medicines:                General Anesthesia Complications:            No immediate complications. Estimated Blood Loss:     Estimated blood loss was minimal. Procedure:                Pre-Anesthesia Assessment:                           - Prior to the procedure, a History and Physical                            was performed, and patient medications and                            allergies were reviewed. The patient's tolerance of                            previous anesthesia was also reviewed. The risks                            and benefits of the procedure and the sedation                            options and risks were discussed with the patient.                            All questions were answered, and informed consent                            was obtained. Prior Anticoagulants: The patient has                            taken no previous anticoagulant or antiplatelet                            agents. ASA Grade Assessment: III - A patient with                            severe systemic disease. After reviewing the risks                            and  benefits, the patient was deemed in                            satisfactory condition to undergo the procedure.                           After obtaining informed consent, the endoscope was                            passed under direct vision. Throughout the                            procedure, the patient's blood pressure, pulse, and                            oxygen saturations were monitored  continuously. The                            [id number] ([id number]) scope was introduced through the                            mouth, and advanced to the second part of duodenum.                            The upper GI endoscopy was technically difficult                            and complex due to excessive bleeding. The patient                            tolerated the procedure well. Scope In: Scope Out: Findings:      Multiple columns of large (> 5 mm) varices were found in the mid       esophagus and in the distal esophagus,. They were large in size.       Stigmata of recent bleeding were evident and red wale signs were       present. Seven bands were successfully placed with incomplete       eradication of varices.      Red blood was found in the gastric fundus and in the gastric body. was       large volume of fresh blood in the fundus. 800 mL of maroon color liquid       was suctioned and still he kept having active bleeding somewhere in the       fundus.actively bleeding gastric varices cannot be ruled out.      Red blood was found in the duodenal bulb, in the first portion of the       duodenum and in the second portion of the duodenum. Impression:               - Recently bleeding large (> 5 mm) esophageal                            varices. Incompletely eradicated. Banded.                           -  Red blood in the gastric fundus and in the                            gastric body.                           - Blood in the duodenal bulb and in the second                            portion of the duodenum.                           - No specimens collected. Moderate Sedation:      general anesthesia Recommendation:           - Transfer patient to another hospital.                           - NPO.                           - Continue present medications.                           - Refer to an interventional radiologist today.                           - Patient has a contact  number available for                            emergencies. The signs and symptoms of potential                            delayed complications were discussed with the                            patient. Return to normal activities tomorrow.                            Written discharge instructions were provided to the                            patient.                           - Return patient to ICU for ongoing care. Procedure Code(s):        --- Professional ---                           [phone], Esophagogastroduodenoscopy, flexible,                            transoral; with band ligation of esophageal/gastric                            varices Diagnosis Code(s):        --- Professional ---  I85.01, Esophageal varices with bleeding                           K92.2, Gastrointestinal hemorrhage, unspecified                           K92.0, Hematemesis CPT copyright 2016 [employer]. All rights reserved. The codes documented in this report are preliminary and upon coder review may  be revised to meet current compliance requirements. Kathi Der, MD Kathi Der, MD 11/04/2016 3:04:39 PM Number of Addenda: 0

## 2016-11-04 NOTE — Brief Op Note (Signed)
11/04/2016  3:17 PM  PATIENT:  Chad Ramos  51 y.o. male  PRE-OPERATIVE DIAGNOSIS:  Suspected variceal bleed  POST-OPERATIVE DIAGNOSIS:  varices s/p banding x 7  PROCEDURE:  Procedure(s): ESOPHAGOGASTRODUODENOSCOPY (EGD) WITH PROPOFOL (N/A)  SURGEON:  Surgeon(s) and Role:    * Anzel Kearse, MD - Primary  Findings : ------------------------------------- - Patient with actively bleeding esophageal and possible actively bleeding gastric varices. - 800 mL of fresh blood was suctioned from the stomach but still I was not able to visualize fundus to rule out actively bleeding gastric varices. - 7 bands were placed on very large esophageal varices.  Recommendations --------------------------- - Case discussed with interventional radiology Dr. Fredia Sorrow for possible TIPS / BRTO.  - Recommend transfer to ICU. - 2 more units of PRBC. - Patient with active variceal bleed and he remains critically ill. Prognosis is poor. Long discussion with the wife. Further management and treatment plan discussed with her. She is agreeable with current plan  - GI will follow  Kathi Der MD, FACP 11/04/2016, 3:21 PM  Pager [phone]  If no answer or after 5 PM call [phone]

## 2016-11-04 NOTE — Anesthesia Procedure Notes (Signed)
Arterial Line Insertion Performed by: Rogelia Boga, CRNA  Patient location: PACU. Preanesthetic checklist: patient identified, IV checked, site marked, risks and benefits discussed, surgical consent, monitors and equipment checked, pre-op evaluation, timeout performed and anesthesia consent Patient sedated Left, radial was placed Catheter size: 20 G Hand hygiene performed , maximum sterile barriers used  and Seldinger technique used Allen's test indicative of satisfactory collateral circulation Attempts: 1 Procedure performed without using ultrasound guided technique. Following insertion, dressing applied and Biopatch. Post procedure assessment: normal  Patient tolerated the procedure well with no immediate complications.

## 2016-11-04 NOTE — Procedures (Signed)
Interventional Radiology Procedure Note  Procedure: TIPS, with transhepatic portal access for targeting, and same session paracentesis.  Complications: None Recommendations:  - To ICU, stable vital signs on vent - VIR will follow.  - routine wound care at the right jugular puncture site - routine wound care at the right upper abdomen  Signed,  Yvone Neu. Loreta Ave, DO

## 2016-11-04 NOTE — Progress Notes (Signed)
Patient transported on vent from IR to 70M-03 without complication.

## 2016-11-04 NOTE — Anesthesia Postprocedure Evaluation (Signed)
Anesthesia Post Note  Patient: Chad Ramos  Procedure(s) Performed: Procedure(s) (LRB): ESOPHAGOGASTRODUODENOSCOPY (EGD) WITH PROPOFOL (N/A)     Patient location during evaluation: PACU Anesthesia Type: General Level of consciousness: sedated and patient remains intubated per anesthesia plan Pain management: pain level controlled Vital Signs Assessment: post-procedure vital signs reviewed and stable Respiratory status: patient on ventilator - see flowsheet for VS and patient remains intubated per anesthesia plan Cardiovascular status: blood pressure returned to baseline Anesthetic complications: no    Last Vitals:  Vitals:   11/04/16 1535 11/04/16 1550  BP: (!) 131/92 120/85  Pulse: 73 76  Resp: 18 20  Temp:    SpO2: 100% 100%    Last Pain:  Vitals:   11/04/16 1500  TempSrc: Oral  PainSc:                  Sahid Borba COKER

## 2016-11-04 NOTE — Progress Notes (Signed)
Addendum: Patient went for EGD and was found actively bleeding from esophageal varices. As per GI doctor report:  - Patient with actively bleeding esophageal and possible actively bleeding gastric varices. - 800 mL of fresh blood was suctioned from the stomach but still I was not able to visualize fundus to rule out actively bleeding gastric varices. - 7 bands were placed on very large esophageal varices.  At this moment patient is intubated for airway protection, 2 more units of PRBC's and 2 units of FFP ordered; patient will require transfer to PCCM for further management and IR has been consulted for TIPS/BRTO. Needs ICU.  -patient conditioned is critical and overall prognosis is poor. GI service has discussed in details with patient's wife at bedside.  Vassie Loll MD [phone]

## 2016-11-04 NOTE — Anesthesia Preprocedure Evaluation (Signed)
Anesthesia Evaluation  Patient identified by MRN, date of birth, ID band Patient awake    Reviewed: Allergy & Precautions, NPO status , Patient's Chart, lab work & pertinent test results  Airway Mallampati: II  TM Distance: >3 FB Neck ROM: Full    Dental   Pulmonary neg pulmonary ROS,    breath sounds clear to auscultation       Cardiovascular negative cardio ROS   Rhythm:Regular Rate:Normal     Neuro/Psych negative neurological ROS     GI/Hepatic (+) Hepatitis -Possible variceal bleed   Endo/Other  negative endocrine ROS  Renal/GU negative Renal ROS     Musculoskeletal   Abdominal   Peds  Hematology  (+) anemia , Pancyopenia   Anesthesia Other Findings   Reproductive/Obstetrics                             Anesthesia Physical Anesthesia Plan  ASA: III  Anesthesia Plan: MAC   Post-op Pain Management:    Induction: Intravenous  PONV Risk Score and Plan: 1 and Ondansetron, Propofol infusion and Treatment may vary due to age or medical condition  Airway Management Planned: Natural Airway and Simple Face Mask  Additional Equipment:   Intra-op Plan:   Post-operative Plan:   Informed Consent: I have reviewed the patients History and Physical, chart, labs and discussed the procedure including the risks, benefits and alternatives for the proposed anesthesia with the patient or authorized representative who has indicated his/her understanding and acceptance.     Plan Discussed with: CRNA  Anesthesia Plan Comments:         Anesthesia Quick Evaluation

## 2016-11-04 NOTE — OR Nursing (Signed)
1520- arrived in PACU from endoscopy - orally intubated  And on vent on arrival. Packed RBCs being infused with two peripheral IVs by CRNA. Patient also receiving Propofol and Neo via peripheral IV. Dr. Noreene Larsson and Maple Hudson at bedside. Arterial Line inserted by Benedetto Goad, CRNA in left radial artery.

## 2016-11-04 NOTE — Progress Notes (Signed)
Patient seen and examined. Admitted after midnight secondary to acute GIB. Bleeding appears to be associated with esophageal varices, patient had hx of cirrhosis. With soft BP, but hemodynamically stable otherwise. S/p 2 units of PRBC's , repeat CBC pending currently. GI service consulted and plan is for EGD later today. For further info/details on admission please refer to H&P written by Dr. Julian Reil on 11/04/16.   Vassie Loll MD [phone]

## 2016-11-04 NOTE — H&P (Signed)
History and Physical    Chad Ramos ZOX:[id number] DOB: 1965/10/07 DOA: 11/04/2016  PCP: Patient, No Pcp Per  Patient coming from: Home  I have personally briefly reviewed patient's old medical records in [facility]  Chief Complaint: Hematemesis  HPI: Chad Ramos is a 51 y.o. male with medical history significant of cirrhosis secondary to chronic HBV, pancytopenia.  Patient presents to the ED with c/o vomiting bright red blood onset at 8pm.  No h/o same.  No recent h/o dark tarry stools.  No recent EtOH nor NSAID use.  Patient immigrated from [place] to [place] in early 90s and just recently moved to [place] within last 3 years.  Found to have pancytopenia by PCP, work up revealed chronic cirrhosis likely secondary to chronic HBV infection.  No h/o EGD.   ED Course: No further hematemesis episodes in ED however caked blood noted on lips on arrival.  Additionally patients HGB has dropped to 7.8 from 13.7 a mere 8 days ago.  Patient started on octreotide, protonix, and rocephin.  Dr. Matthias Hughs called, currently plan for EGD later today if he remains stable.   Review of Systems: As per HPI otherwise 10 point review of systems negative.   Past Medical History:  Diagnosis Date  . Cirrhosis (HCC)   . Pancytopenia (HCC) 10/07/2016    History reviewed. No pertinent surgical history.   reports that he has never smoked. He has never used smokeless tobacco. He reports that he does not drink alcohol or use drugs.  No Known Allergies  No family history on file.   Prior to Admission medications   Medication Sig Start Date End Date Taking? Authorizing Provider  Multiple Vitamins-Minerals (MULTIVITAMIN WITH MINERALS) tablet Take 1 tablet by mouth daily.   Yes [provider]    Physical Exam: Vitals:   11/04/16 0315 11/04/16 0330 11/04/16 0345 11/04/16 0400  BP: (!) 88/58 (!) 89/55 (!) 93/57 (!) 95/57  Pulse: 81 84 88 85  Resp: SpO2: 99% 100% 100% 99%     Constitutional: NAD, calm, comfortable Eyes: PERRL, lids and conjunctivae normal ENMT: Dry blood about mouth. Neck: normal, supple, no masses, no thyromegaly Respiratory: clear to auscultation bilaterally, no wheezing, no crackles. Normal respiratory effort. No accessory muscle use.  Cardiovascular: Regular rate and rhythm, no murmurs / rubs / gallops. No extremity edema. 2+ pedal pulses. No carotid bruits.  Abdomen: no tenderness, no masses palpated. No hepatosplenomegaly. Bowel sounds positive.  Musculoskeletal: no clubbing / cyanosis. No joint deformity upper and lower extremities. Good ROM, no contractures. Normal muscle tone.  Skin: no rashes, lesions, ulcers. No induration Neurologic: CN 2-12 grossly intact. Sensation intact, DTR normal. Strength 5/5 in all 4.  Psychiatric: Normal judgment and insight. Alert and oriented x 3. Normal mood.    Labs on Admission: I have personally reviewed following labs and imaging studies  CBC:  Recent Labs Lab 11/04/16 0143 11/04/16 0150  WBC 4.9  --   NEUTROABS 3.4  --   HGB 7.8* 6.8*  HCT 23.0* 20.0*  MCV 93.1  --   PLT 35*  --    Basic Metabolic Panel:  Recent Labs Lab 11/04/16 0143 11/04/16 0150  NA 139 142  K 4.4 4.4  CL 112* 111  CO2 23  --   GLUCOSE 140* 132*  BUN 18 18  CREATININE 0.95 0.90  CALCIUM 7.3*  --    GFR: Estimated Creatinine Clearance: 77.3 mL/min (by C-G formula based on SCr of  0.9 mg/dL). Liver Function Tests:  Recent Labs Lab 11/04/16 0143  AST 32  ALT 24  ALKPHOS 48  BILITOT 0.9  PROT 3.9*  ALBUMIN 1.7*    Recent Labs Lab 11/04/16 0143  LIPASE 27   No results for input(s): AMMONIA in the last 168 hours. Coagulation Profile:  Recent Labs Lab 11/04/16 0143  INR 2.32   Cardiac Enzymes: No results for input(s): CKTOTAL, CKMB, CKMBINDEX, TROPONINI in the last 168 hours. BNP (last 3 results) No results for input(s): PROBNP in the last 8760 hours. HbA1C: No results for input(s):  HGBA1C in the last 72 hours. CBG: No results for input(s): GLUCAP in the last 168 hours. Lipid Profile: No results for input(s): CHOL, HDL, LDLCALC, TRIG, CHOLHDL, LDLDIRECT in the last 72 hours. Thyroid Function Tests: No results for input(s): TSH, T4TOTAL, FREET4, T3FREE, THYROIDAB in the last 72 hours. Anemia Panel: No results for input(s): VITAMINB12, FOLATE, FERRITIN, TIBC, IRON, RETICCTPCT in the last 72 hours. Urine analysis: No results found for: COLORURINE, APPEARANCEUR, LABSPEC, PHURINE, GLUCOSEU, HGBUR, BILIRUBINUR, KETONESUR, PROTEINUR, UROBILINOGEN, NITRITE, LEUKOCYTESUR  Radiological Exams on Admission: No results found.  EKG: Independently reviewed.  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 UGIB - concern for variceal bleed given rapid HGB drop, cirrhosis history, and even "large esophageal varices" seen on the CT scan abd/pelvis on 8/31 1. protonix gtt 2. Octreotide gtt 3. Empiric rocephin 4. Tele monitor 5. No further bleeding stigmata since arrival in ED 6. NPO 2. Acute blood loss anemia - 1. 2 unit PRBC transfusion ordered and has been started 2. Repeat h/h ordered at 0700 (for post transfusion) then Q6H 3. Cirrhosis - chronic, likely secondary to HBV 4. Pancytopenia - chronic, likely secondary to cirrhosis  DVT prophylaxis: SCDs Code Status: Full Family Communication: Wife at bedside Disposition Plan: Home after admit Consults called: Dr. Matthias Hughs with GI Admission status: Admit to inpatient   Hillary Bow DO [facility] Pager [phone]  If 7AM-7PM, please contact day team taking care of patient [email or web address] Password [facility]  11/04/2016, 4:23 AM

## 2016-11-04 NOTE — ED Triage Notes (Signed)
Patient from home, having 3 episodes of hematemesis at home, bright red in nature.  Mild burning sensation in abdomen.  CAOX4.

## 2016-11-04 NOTE — Progress Notes (Signed)
Pt intubated for procedure by anesthesia, Per Dr. Doretha Imus, unable to visualize active bleed in stomach.  Esophagus banded x 7 by MD.  Dr. Doretha Imus notified IR of immediate need for further intervention.  Dr. Noreene Larsson at bedside, Istat obtained.  Per Dr. Noreene Larsson, 4 units of PRBCs to infuse immediately.  2 units started in Endo with 2 units of PRBCs taken to PACU in cooler. Pt taken to PACU to await IR.  Aline placed in pacu by Elijah Birk, CRNA.  Report given to PACU RN Vernona Rieger,  CRNA and Anesthesiologist at bedside.

## 2016-11-04 NOTE — Progress Notes (Signed)
We have been notified of the need for GI consultation for this pt w/ Hep B, cirrhosis, and hematemesis, now on octreotide and without further hematemesis since arriving at ER.  Will plan to see pt later this morning and most likely do EGD later today.  Please contact us if pt becomes unstable in the meantime, or if you have any questions.   Florencia Reasons, M.D. Pager [phone] If no answer or after 5 PM call [phone]

## 2016-11-04 NOTE — Transfer of Care (Signed)
Immediate Anesthesia Transfer of Care Note  Patient: Chad Ramos  Procedure(s) Performed: Procedure(s): ESOPHAGOGASTRODUODENOSCOPY (EGD) WITH PROPOFOL (N/A)  Patient Location: PACU  Anesthesia Type:General  Level of Consciousness: sedated  Airway & Oxygen Therapy: Patient remains intubated per anesthesia plan and Patient placed on Ventilator (see vital sign flow sheet for setting)  Post-op Assessment: Report given to RN and Post -op Vital signs reviewed and stable  Post vital signs: Reviewed and stable  Last Vitals:  Vitals:   11/04/16 1535 11/04/16 1550  BP: (!) 131/92 120/85  Pulse: 73 76  Resp: 18 20  Temp:    SpO2: 100% 100%    Last Pain:  Vitals:   11/04/16 1500  TempSrc: Oral  PainSc:          Complications: No apparent anesthesia complications

## 2016-11-04 NOTE — Progress Notes (Signed)
CRITICAL VALUE ALERT  Critical Value:  Lactic acid 3.6  Date & Time Notied:  11/04/16 2210  Provider Notified: Pola Corn  Orders Received/Actions taken: fluid bolus

## 2016-11-04 NOTE — Progress Notes (Addendum)
..   Name: Chad Ramos MRN: [id number] DOB: Jun 22, 1965    ADMISSION DATE:  11/04/2016 CONSULTATION DATE: 11/04/16  REFERRING MD :  Levora Angel MD  CHIEF COMPLAINT:  Hematemesis  BRIEF PATIENT DESCRIPTION: 51 yr old male with chronic Liver cirrhosis secondary to Hep B found to have bleeding esophageal varices s/p banding x 7 s/p TIPS by IR now intubated and transferred to ICU  SIGNIFICANT EVENTS  Transfused a total of 7 units of PRBCs 3 Plts + 2 FFP S/p EGD with variceal banding s/p TIPS   STUDIES:  CT Abdomen with contrast EGD IR TIPS  HISTORY OF PRESENT ILLNESS:  51 yr old male immigrated from [place] to [place] in the early 90s has aPMHx significant for Chronic Hepatitis B and pancytopenia admitted on 9/19 for hematemesis in the setting of no ETOH use and no NSAID use. Found to have chronic cirrhosis with extensive varices on EGD s/p 7 bands with blood seen in fundus and duodenal bulb referred to IR for TIPS. Pt now s/p TIPS intubated on mechanical ventilation.  PAST MEDICAL HISTORY :   has a past medical history of Cirrhosis (HCC) and Pancytopenia (HCC) (10/07/2016).  has no past surgical history on file. Prior to Admission medications   Medication Sig Start Date End Date Taking? Authorizing Provider  Multiple Vitamins-Minerals (MULTIVITAMIN WITH MINERALS) tablet Take 1 tablet by mouth daily.   Yes [provider]   No Known Allergies  FAMILY HISTORY:  family history is not on file. SOCIAL HISTORY:  reports that he has never smoked. He has never used smokeless tobacco. He reports that he does not drink alcohol or use drugs.  REVIEW OF SYSTEMS:   Constitutional: Negative for fever, chills, weight loss, malaise/fatigue and diaphoresis.  HENT: Negative for hearing loss, ear pain, nosebleeds, congestion, sore throat, neck pain, tinnitus and ear discharge.   Eyes: Negative for blurred vision, double vision, photophobia, pain, discharge and redness.  Respiratory: Negative  for cough, hemoptysis, sputum production, shortness of breath, wheezing and stridor.   Cardiovascular: Negative for chest pain, palpitations, orthopnea, claudication, leg swelling and PND.  Gastrointestinal: Negative for heartburn, nausea, vomiting, abdominal pain, diarrhea, constipation, blood in stool and melena.  Genitourinary: Negative for dysuria, urgency, frequency, hematuria and flank pain.  Musculoskeletal: Negative for myalgias, back pain, joint pain and falls.  Skin: Negative for itching and rash.  Neurological: Negative for dizziness, tingling, tremors, sensory change, speech change, focal weakness, seizures, loss of consciousness, weakness and headaches.  Endo/Heme/Allergies: Negative for environmental allergies and polydipsia. Does not bruise/bleed easily.  SUBJECTIVE:   VITAL SIGNS: Temp:  [97 F (36.1 C)-99.4 F (37.4 C)] 97 F (36.1 C) (09/19 1520) Pulse Rate:  [73-96] 76 (09/19 1550) Resp:  [13-24] 20 (09/19 1550) BP: (79-131)/(55-92) 120/85 (09/19 1550) SpO2:  [97 %-100 %] 100 % (09/19 1550) Arterial Line BP: (108-146)/(55-80) 108/55 (09/19 1550) FiO2 (%):  [100 %] 100 % (09/19 1935) Weight:  [59 kg (130 lb)] 59 kg (130 lb) (09/19 1235)  PHYSICAL EXAMINATION: General:  Thin male not cachetic Neuro:  Sedated on propofol HEENT: normocephalic atraumatic ETT in place  Cardiovascular:  Tachycardic no R/M?G appreciated. No lower ext edema and pulses equal Lungs:  ventialtor associated breath sounds. No wheezing no crackles Abdomen:  Soft + BS  + fluid wave Musculoskeletal:  No signs of atrophy intact passive range of motion Skin:  Intact dry    Recent Labs Lab 11/04/16 0143 11/04/16 0150 11/04/16 1459  NA 139 142 142  K  4.4 4.4 4.8  CL 112* 111 112*  CO2 23  --   --   BUN 18 18 21*  CREATININE 0.95 0.90 0.80  GLUCOSE 140* 132* 96    Recent Labs Lab 11/04/16 0143  11/04/16 1459 11/04/16 1618 11/04/16 1900  HGB 7.8*  < > 5.1* 11.0* 10.8*  HCT 23.0*  <  > 15.0* 30.9* 31.8*  WBC 4.9  --   --  5.1 6.8  PLT 35*  --   --  47*  47* 59*  < > = values in this interval not displayed. Dg Chest Port 1 View  Result Date: 11/04/2016 CLINICAL DATA:  Intubation, status post TIPS EXAM: PORTABLE CHEST 1 VIEW COMPARISON:  10/16/2016 FINDINGS: Endotracheal tube is 3.4 cm above the carina. Low lung volumes with mild central vascular congestion and basilar atelectasis. No large effusion or pneumothorax. No focal pneumonia or significant collapse. TIPS stent noted in the right upper quadrant. IMPRESSION: Endotracheal tube 3.4 cm above the carina Low lung volumes with vascular congestion and basilar atelectasis Electronically Signed   By: Judie Petit.  Shick M.D.   On: 11/04/2016 20:04    EGD 9/19 Findings : - Patient with actively bleeding esophageal and possible actively bleeding gastric varices. - 800 mL of fresh blood was suctioned from the stomach but still I was not able to visualize fundus to rule out actively bleeding gastric varices. - 7 bands were placed on very large esophageal varices  CT ABDOMEN/PELVIS IMPRESSION: 1. Stigmata of advanced cirrhosis with severe portal hypertension, most notable for splenomegaly and large gastric and esophageal varices, as discussed above. No aggressive appearing hepatic lesion is noted on today's examination to suggest hepatocellular carcinoma at this time. 2. Moderate to large volume of ascites. Importantly, there is widespread peritoneal thickening and enhancement which may suggest peritonitis or malignancy. Correlation with paracentesis is suggested for fluid analysis. 3. Subtle areas of wedge-shaped hypoperfusion in both kidneys. This is nonspecific, but could be seen in the setting of pyelonephritis. Correlation with urinalysis is recommended.  ASSESSMENT / PLAN:  NEURO: Sedation: with Propofol ggt RASS-3 No focal deficits prior to procedure pt alert and oriented male Given chronic cirrhosis when patient is  weaned from sedation if patient is altered consider Hepatic encephalopathy   CARDIAC: Hypovolemia secondary to Gastrointestinal hemorrhage Currently not on any vasopressors Received albumin 5% + 2.8 L of blood products  MAP goal >77mmHg Left radial A- line for hemodynamic montioring No h/o cardiac disease At risk for demand ischemia sent Trop and  ECHO to obtain LVEF Large bore peripherals in place for resuscitation If additional access needed will place a CVC  PULMONARY: Respiratory acidosis Intubated on Mechanical ventilation Initial ABG 7.18/ 53/346/19.5 Acid base abnormality: Respiratory and non AG metabolic acidosis Increased minute ventilation and decreased FiO2 CXR reviewed ETT in good position, bibasilar atelectasis If pt remains hemodynamically stable Will begin Daily wean protocol with SBT As tolerated   ID: Spontaneous Bacterial Peritonitis prophylaxis indicated Patient continues on Empiric antibiotics with Rocephin Pt also received perioperative Ancef Blood cx x 2 sent LINES: left radial A- line, peripheral IVs CATHETERS: indwelling foley  Endocrine: No H/o insulin dependence Pt continues NPO ICU Glycemic protocol Q 4 ISS  GI: Hepatic cirrhosis with ascites, Chronic Hepatitis B MELD 21 POD 0 TIPS f/u GI and IR recommendations S/p paracentesis by IR - fluid should be sent for culture  Continues on empiric antibiotics for SBP  UGIB with Esophageal varices Remains NPO No OGT/NGT insertion patient has  significant varices s/p banding When pt resumes PO propanolol prophylaxis for varices Continues on Protonix ggt and Octreotide ggt Zofran IV prn nausea  Heme: Upper GI Bleed secondary to Esophageal Varices Anemia secondary to Acute GI blood loss  Pt has received a total of 7 units PRBCs 2 FFPs and 3 Plts ..B POS  Low fibrinogen low plts INR 1-2.07 Consumptive coagulopathy no schistocytes seen on initial smear at 4 pm May also be dilutional from the  volume of products given. CBC Q 8  if Hgb <7 transfuse PRBCs Keep Plts >20 and INR <2 DVT PPx-> held patient actively bleeding only SCDs  RENAL Acid base abnormality: Respiratory Acidosis with metabolic acidosis NAGMA most likely hyperchloremic metabolic acidosis Also noted Lactic Acid noted as 3.6 most likely hypoperfusion secondary to Hypovolemia from Acute GI Bleeding Trend lactate, Creatinine, BMP and Replete electrolytes as needed  On CT Abdomen/Pelvis there are wedge shaped infacts in the kidney Urine color noted on exam-?hematuria Send UA  Lab Results  Component Value Date   CREATININE 0.80 11/04/2016   CREATININE 0.90 11/04/2016   CREATININE 0.95 11/04/2016  continue to monitor UOP Ins/Outs: 5.518L/1.5-> 650cc of UOP  .Marland Kitchen    Code Status Orders        Start     Ordered   11/04/16 1941  Full code  Continuous     11/04/16 1941    Code Status History    Date Active Date Inactive Code Status Order ID Comments User Context   11/04/2016  4:10 AM 11/04/2016  7:41 PM Full Code [id number]  Hillary Bow, DO ED     DISPOSITION: ICU FAMILY: Wife and  2 kids CRITICAL CARE TIME: 45 mins CODE STATUS: Limited   I have discussed the plan of care with the medical team (including consultants, NP and RN) and patient/patient's family members. Family understands that patient's prognosis is guarded and that he is Critically Ill.  His wife expressed that he did not want CPR and did not want aggressive measures if there was no hope of reversal of underlying condition or return to his previous baseline.. Patient Limited code status currently.   Signed Dr Newell Coral Pulmonary Critical Care Locums Pulmonary and Critical Care Medicine [facility] Pager: [phone]  11/04/2016, 8:15 PM

## 2016-11-04 NOTE — Anesthesia Procedure Notes (Signed)
Procedure Name: Intubation Date/Time: 11/04/2016 2:00 PM Performed by: Samara Deist Pre-anesthesia Checklist: Patient identified, Emergency Drugs available, Suction available, Patient being monitored and Timeout performed Patient Re-evaluated:Patient Re-evaluated prior to induction Oxygen Delivery Method: Circle system utilized Preoxygenation: Pre-oxygenation with 100% oxygen Induction Type: IV induction and Rapid sequence Laryngoscope Size: Glidescope and 4 Grade View: Grade I Tube type: Oral Tube size: 7.5 mm Number of attempts: 1 Airway Equipment and Method: Stylet and Video-laryngoscopy Placement Confirmation: ETT inserted through vocal cords under direct vision,  positive ETCO2 and breath sounds checked- equal and bilateral Secured at: 21 cm Tube secured with: Tape Dental Injury: Teeth and Oropharynx as per pre-operative assessment

## 2016-11-04 NOTE — Progress Notes (Signed)
Chief Complaint: Patient was seen in consultation today for  Chief Complaint  Patient presents with  . Hematemesis   at the request of Dr. Kathi Der  Referring Physician(s): Dr. Kathi Der  Supervising Physician: Irish Lack  Patient Status: [facility] - In-pt  History of Present Illness: Chad Ramos is a 51 y.o. male who was recently diagnosed with cirrhosis, portal HTN, varices and ascites. He was in process of ongoing outpt workup. He developed acute large volume hematemesis last pm. Came to ER early this am.  Taken for endo and found to have large volume of fresh blood in stomach and distal esophagus. Bleeding felt to be coming from varices and even though some bands were placed, hemostasis is not felt to be achieved. Dr. Levora Angel consulted IR and discussed possible TIPS/BRTO procedure. Pt currently in PACU, intubated. Wife in holding area and able to give history and discussion. PMHx, meds, labs, imaging reviewed. Significant varices noted but no gastro-splenorenal shunt noted, therefore BRTO procedure not indicated.     Past Medical History:  Diagnosis Date  . Cirrhosis (HCC)   . Pancytopenia (HCC) 10/07/2016    History reviewed. No pertinent surgical history.  Allergies: Patient has no known allergies.  Medications:  Current Facility-Administered Medications:  .  0.9 %  sodium chloride infusion, , Intravenous, Continuous, Brahmbhatt, Parag, MD .  0.9 %  sodium chloride infusion, , Intravenous, Once, Val Eagle, MD .  Mitzi Hansen Hold] cefTRIAXone (ROCEPHIN) 1 g in dextrose 5 % 50 mL IVPB, 1 g, Intravenous, Daily, Hillary Bow, DO, Stopped at 11/04/16 1200 .  [COMPLETED] octreotide (SANDOSTATIN) 2 mcg/mL load via infusion 50 mcg, 50 mcg, Intravenous, Once, 50 mcg at 11/04/16 0200 **AND** octreotide (SANDOSTATIN) 500 mcg in sodium chloride 0.9 % 250 mL (2 mcg/mL) infusion, 50 mcg/hr, Intravenous, Continuous, Horton, Mayer Masker, MD, Last Rate:  25 mL/hr at 11/04/16 1422, 50 mcg/hr at 11/04/16 1422 .  [MAR Hold] ondansetron (ZOFRAN) tablet 4 mg, 4 mg, Oral, Q6H PRN **OR** [MAR Hold] ondansetron (ZOFRAN) injection 4 mg, 4 mg, Intravenous, Q6H PRN, Julian Reil, Jared M, DO .  pantoprazole (PROTONIX) 80 mg in sodium chloride 0.9 % 250 mL (0.32 mg/mL) infusion, 8 mg/hr, Intravenous, Continuous, Horton, Mayer Masker, MD, Last Rate: 25 mL/hr at 11/04/16 1350 .  [MAR Hold] pantoprazole (PROTONIX) injection 40 mg, 40 mg, Intravenous, Q12H, Horton, Mayer Masker, MD    History reviewed. No pertinent family history.  Social History   Social History  . Marital status: Married    Spouse name: N/A  . Number of children: N/A  . Years of education: N/A   Social History Main Topics  . Smoking status: Never Smoker  . Smokeless tobacco: Never Used  . Alcohol use No  . Drug use: No  . Sexual activity: Not Asked   Other Topics Concern  . None   Social History Narrative  . None    Review of Systems: A 12 point ROS discussed and pertinent positives are indicated in the HPI above.  All other systems are negative.  Review of Systems  Vital Signs: BP 120/85   Pulse 76   Temp (!) 97 F (36.1 C)   Resp 20   Ht  (1.6 m)   Wt 130 lb (59 kg)   SpO2 100%   BMI 23.03 kg/m   Physical Exam  Constitutional: He appears well-developed and well-nourished.  Neck: No JVD present.  Cardiovascular: Normal rate, regular rhythm, normal heart sounds and intact distal pulses.  Pulmonary/Chest: Breath sounds normal. No respiratory distress.  Abdominal: Soft. He exhibits distension. He exhibits no mass.  Neurological:  Intubated/sedated  Skin: Skin is dry. There is pallor.      Imaging: Ct Abdomen W Contrast  Result Date: 10/16/2016 CLINICAL DATA:  51 year old male with history of pancytopenia and cirrhosis. Abdominal distention. EXAM: CT ABDOMEN WITH CONTRAST TECHNIQUE: Multidetector CT imaging of the abdomen was performed using the standard  protocol following bolus administration of intravenous contrast. CONTRAST:  ISOVUE-300 IOPAMIDOL (ISOVUE-300) INJECTION 61% COMPARISON:  No priors. FINDINGS: Lower chest: Severe irregular thickening of the distal esophagus, which appears to correspond to multiple large esophageal varices measuring up to 11 mm in diameter (axial image 29 of series 4). Scarring in the inferior segment of the lingula and medial segment of the right middle lobe. Hepatobiliary: Liver has a markedly shrunken appearance and nodular contour, compatible with advanced cirrhosis. No discrete cystic or solid hepatic lesions are noted. No intra or extrahepatic biliary ductal dilatation. Gallbladder is unremarkable in appearance. Pancreas: No pancreatic mass. No pancreatic ductal dilatation. No well-defined peripancreatic fluid collection. Spleen: Spleen is enlarged measuring 12.7 x 4.7 x 16.7 cm (estimated splenic volume of 498 mL). Adrenals/Urinary Tract: There are some subtle areas of hypoenhancement in the kidneys bilaterally that are somewhat wedge-shaped in appearance. No discrete renal lesions are identified. No hydroureteronephrosis in the visualized abdomen. Bilateral adrenal glands are normal in appearance. Stomach/Bowel: Stomach is decompressed, but generally unremarkable in appearance, with exception of several gastric varices most notable around the GE junction and proximal body. No pathologic dilatation of visualized portions of small bowel or colon. Vascular/Lymphatic: Aortic atherosclerosis, without definite aneurysm in the abdominal vasculature. Portal vein is dilated measuring 15 mm in diameter. Numerous portosystemic collateral pathways, most notable for gastric and esophageal varices. Numerous prominent borderline enlarged retroperitoneal and upper abdominal lymph nodes measuring up to 9 mm in short axis (nonspecific). Other: Moderate to large volume of ascites with extensive areas of peritoneal enhancement best  appreciated on axial images 79 of series 4 and 75 of series 4. No pneumoperitoneum. Musculoskeletal: There are no aggressive appearing lytic or blastic lesions noted in the visualized portions of the skeleton. IMPRESSION: 1. Stigmata of advanced cirrhosis with severe portal hypertension, most notable for splenomegaly and large gastric and esophageal varices, as discussed above. No aggressive appearing hepatic lesion is noted on today's examination to suggest hepatocellular carcinoma at this time. 2. Moderate to large volume of ascites. Importantly, there is widespread peritoneal thickening and enhancement which may suggest peritonitis or malignancy. Correlation with paracentesis is suggested for fluid analysis. 3. Subtle areas of wedge-shaped hypoperfusion in both kidneys. This is nonspecific, but could be seen in the setting of pyelonephritis. Correlation with urinalysis is recommended. These results will be called to the ordering clinician or representative by the Radiologist Assistant, and communication documented in the PACS or zVision Dashboard. Electronically Signed   By: Trudie Reed M.D.   On: 10/16/2016 16:11    Labs:  CBC:  Recent Labs  09/25/16 0928 10/07/16 1443 10/27/16 1519 11/04/16 0143 11/04/16 0150 11/04/16 1459  WBC 2.4* 2.4* 2.5* 4.9  --   --   HGB 12.9* 13.2 13.7 7.8* 6.8* 5.1*  HCT 37.9* 38.9* 40.4 23.0* 20.0* 15.0*  PLT 33* 34* 37* 35*  --   --     COAGS:  Recent Labs  11/04/16 0143  INR 2.32    BMP:  Recent Labs  10/07/16 1443 10/27/16 1519 11/04/16 0143 11/04/16 0150  11/04/16 1459  NA 139 138 139 142 142  K 4.0 3.9 4.4 4.4 4.8  CL 109 107 112* 111 112*  CO2 --   --   GLUCOSE 138* 123* 140* 132* 96  BUN 21*  CALCIUM 8.5* 8.1* 7.3*  --   --   CREATININE 0.92 0.91 0.95 0.90 0.80  GFRNONAA >60 97 >60  --   --   GFRAA >60 112 >60  --   --     LIVER FUNCTION TESTS:  Recent Labs  10/07/16 1443 10/27/16 1519  11/04/16 0143  BILITOT 1.0 1.0 0.9  AST 54* 40* 32  ALT 40 32 24  ALKPHOS 95  --  48  PROT 6.9 6.9 3.9*  ALBUMIN 2.8*  --  1.7*    TUMOR MARKERS: No results for input(s): AFPTM, CEA, CA199, CHROMGRNA in the last 8760 hours.  Assessment and Plan: Acute GI bleed secondary to gastro-esophageal varices from severe portal hypertension and underlying cirrhosis. After discussion with Dr. Fredia Sorrow and review of imaging, feel best treatment plan is TIPS with selective coil embolization. Current MELD =16 Child-Pugh C Currently receiving PRBC and will also need FFP for coagulopathy. Long discussion with pt wife regarding underlying disease process and role of emergent treatment. Explained need for close post procedure observation and follow up. Risks and benefits of TIPS, BRTO and/or additional variceal embolization were discussed with the patient and/or the patient's family including, but not limited to, infection, bleeding, damage to adjacent structures, worsening hepatic and/or cardiac function, non-target embolization and death.  Pt wife reports pt did express to her not wanting any heroic measures, but he is listed as full code and she wishes to proceed as she was not prepared for this emergency. Consent obtained.  This interventional procedure involves the use of X-rays and because of the nature of the planned procedure, it is possible that we will have prolonged use of X-ray fluoroscopy.  Potential radiation risks to you include (but are not limited to) the following: - A slightly elevated risk for cancer  several years later in life. This risk is typically less than 0.5% percent. This risk is low in comparison to the normal incidence of human cancer, which is 33% for women and 50% for men according to the [employer] Cancer Society. - Radiation induced injury can include skin redness, resembling a rash, tissue breakdown / ulcers and hair loss (which can be temporary or permanent).   The  likelihood of either of these occurring depends on the difficulty of the procedure and whether you are sensitive to radiation due to previous procedures, disease, or genetic conditions.   IF your procedure requires a prolonged use of radiation, you will be notified and given written instructions for further action.  It is your responsibility to monitor the irradiated area for the 2 weeks following the procedure and to notify your physician if you are concerned that you have suffered a radiation induced injury.    All of the patient's questions were answered, patient is agreeable to proceed.  Consent signed and in chart.   Thank you for this interesting consult.  I greatly enjoyed meeting Chad Ramos and look forward to participating in their care.  A copy of this report was sent to the requesting provider on this date.  Electronically Signed: Brayton El, PA-C 11/04/2016, 4:00 PM   I spent a total of 40 minutes in face to face in clinical consultation, greater than 50%  of which was counseling/coordinating care for GI bleed requiring emergent TIPS procedure

## 2016-11-04 NOTE — Progress Notes (Signed)
Critical ABG results given to Dr. Carlota Raspberry at bedside.

## 2016-11-05 ENCOUNTER — Encounter (HOSPITAL_COMMUNITY): Payer: Self-pay | Admitting: Interventional Radiology

## 2016-11-05 ENCOUNTER — Inpatient Hospital Stay (HOSPITAL_COMMUNITY): Payer: BLUE CROSS/BLUE SHIELD

## 2016-11-05 DIAGNOSIS — K922 Gastrointestinal hemorrhage, unspecified: Secondary | ICD-10-CM

## 2016-11-05 LAB — PREPARE PLATELET PHERESIS
UNIT DIVISION: 0
UNIT DIVISION: 0
Unit division: 0
Unit division: 0

## 2016-11-05 LAB — MAGNESIUM: MAGNESIUM: 1.5 mg/dL — AB (ref 1.7–2.4)

## 2016-11-05 LAB — PREPARE FRESH FROZEN PLASMA
UNIT DIVISION: 0
Unit division: 0

## 2016-11-05 LAB — POCT I-STAT 3, ART BLOOD GAS (G3+)
Acid-base deficit: 2 mmol/L (ref 0.0–2.0)
Bicarbonate: 21.7 mmol/L (ref 20.0–28.0)
O2 Saturation: 99 %
PCO2 ART: 30 mmHg — AB (ref 32.0–48.0)
PH ART: 7.467 — AB (ref 7.350–7.450)
TCO2: 23 mmol/L (ref 22–32)
pO2, Arterial: 151 mmHg — ABNORMAL HIGH (ref 83.0–108.0)

## 2016-11-05 LAB — GLUCOSE, CAPILLARY
GLUCOSE-CAPILLARY: 144 mg/dL — AB (ref 65–99)
GLUCOSE-CAPILLARY: 127 mg/dL — AB (ref 65–99)
GLUCOSE-CAPILLARY: 126 mg/dL — AB (ref 65–99)
Glucose-Capillary: 128 mg/dL — ABNORMAL HIGH (ref 65–99)
Glucose-Capillary: 137 mg/dL — ABNORMAL HIGH (ref 65–99)
Glucose-Capillary: 149 mg/dL — ABNORMAL HIGH (ref 65–99)

## 2016-11-05 LAB — CBC WITH DIFFERENTIAL/PLATELET
BASOS ABS: 0 10*3/uL (ref 0.0–0.1)
BASOS PCT: 0 %
EOS ABS: 0.1 10*3/uL (ref 0.0–0.7)
Eosinophils Relative: 1 %
HCT: 28.3 % — ABNORMAL LOW (ref 39.0–52.0)
HEMOGLOBIN: 9.8 g/dL — AB (ref 13.0–17.0)
Lymphocytes Relative: 17 %
Lymphs Abs: 0.9 10*3/uL (ref 0.7–4.0)
MCH: 29.6 pg (ref 26.0–34.0)
MCHC: 34.6 g/dL (ref 30.0–36.0)
MCV: 85.5 fL (ref 78.0–100.0)
Monocytes Absolute: 0.5 10*3/uL (ref 0.1–1.0)
Monocytes Relative: 9 %
NEUTROS PCT: 72 %
Neutro Abs: 3.8 10*3/uL (ref 1.7–7.7)
PLATELETS: 47 10*3/uL — AB (ref 150–400)
RBC: 3.31 MIL/uL — AB (ref 4.22–5.81)
RDW: 16.1 % — ABNORMAL HIGH (ref 11.5–15.5)
WBC: 5.2 10*3/uL (ref 4.0–10.5)

## 2016-11-05 LAB — BPAM PLATELET PHERESIS
BLOOD PRODUCT EXPIRATION DATE: 201809202359
BLOOD PRODUCT EXPIRATION DATE: 201809222359
BLOOD PRODUCT EXPIRATION DATE: 201809212359
Blood Product Expiration Date: 201809212359
ISSUE DATE / TIME: 201809190924
ISSUE DATE / TIME: 201809191250
ISSUE DATE / TIME: 201809191706
UNIT TYPE AND RH: 6200
UNIT TYPE AND RH: 6200
UNIT TYPE AND RH: 9500
Unit Type and Rh: 6200

## 2016-11-05 LAB — BASIC METABOLIC PANEL
ANION GAP: 2 — AB (ref 5–15)
BUN: 17 mg/dL (ref 6–20)
CALCIUM: 6.9 mg/dL — AB (ref 8.9–10.3)
CO2: 19 mmol/L — ABNORMAL LOW (ref 22–32)
Chloride: 118 mmol/L — ABNORMAL HIGH (ref 101–111)
Creatinine, Ser: 0.78 mg/dL (ref 0.61–1.24)
GFR calc non Af Amer: >60 mL/min (ref >60)
Glucose, Bld: 143 mg/dL — ABNORMAL HIGH (ref 65–99)
Potassium: 4 mmol/L (ref 3.5–5.1)
SODIUM: 139 mmol/L (ref 135–145)

## 2016-11-05 LAB — BPAM FFP
BLOOD PRODUCT EXPIRATION DATE: 201809242359
BLOOD PRODUCT EXPIRATION DATE: 201809242359
ISSUE DATE / TIME: 201809191638
ISSUE DATE / TIME: 201809191638
UNIT TYPE AND RH: 7300
Unit Type and Rh: 7300

## 2016-11-05 LAB — ECHOCARDIOGRAM COMPLETE
Height: 63 in
Weight: 2317.48 oz

## 2016-11-05 LAB — PLATELET COUNT: Platelets: 47 10*3/uL — ABNORMAL LOW (ref 150–400)

## 2016-11-05 LAB — TROPONIN I
Troponin I: <0.03 ng/mL (ref <0.03)
Troponin I: 0.07 ng/mL (ref <0.03)
Troponin I: 0.04 ng/mL (ref <0.03)

## 2016-11-05 LAB — DIC (DISSEMINATED INTRAVASCULAR COAGULATION)PANEL
D-Dimer, Quant: 6.67 ug/mL-FEU — ABNORMAL HIGH (ref 0.00–0.50)
INR: 1.69
Platelets: 77 10*3/uL — ABNORMAL LOW (ref 150–400)
Smear Review: NONE SEEN
aPTT: 38 seconds — ABNORMAL HIGH (ref 24–36)

## 2016-11-05 LAB — CBC
HCT: 28.4 % — ABNORMAL LOW (ref 39.0–52.0)
Hemoglobin: 10.1 g/dL — ABNORMAL LOW (ref 13.0–17.0)
MCH: 30.2 pg (ref 26.0–34.0)
MCHC: 35.6 g/dL (ref 30.0–36.0)
MCV: 85 fL (ref 78.0–100.0)
Platelets: 52 10*3/uL — ABNORMAL LOW (ref 150–400)
RBC: 3.34 MIL/uL — ABNORMAL LOW (ref 4.22–5.81)
RDW: 15.8 % — AB (ref 11.5–15.5)
WBC: 6.4 10*3/uL (ref 4.0–10.5)

## 2016-11-05 LAB — PHOSPHORUS: PHOSPHORUS: 3.5 mg/dL (ref 2.5–4.6)

## 2016-11-05 LAB — LACTIC ACID, PLASMA
LACTIC ACID, VENOUS: 2.2 mmol/L — AB (ref 0.5–1.9)
LACTIC ACID, VENOUS: 1.7 mmol/L (ref 0.5–1.9)

## 2016-11-05 LAB — APTT: APTT: 40 s — AB (ref 24–36)

## 2016-11-05 LAB — PROTIME-INR
INR: 2.03
Prothrombin Time: 22.8 seconds — ABNORMAL HIGH (ref 11.4–15.2)

## 2016-11-05 LAB — DIC (DISSEMINATED INTRAVASCULAR COAGULATION) PANEL
FIBRINOGEN: 133 mg/dL — AB (ref 210–475)
PROTHROMBIN TIME: 19.7 s — AB (ref 11.4–15.2)

## 2016-11-05 LAB — HEMOGLOBIN AND HEMATOCRIT, BLOOD
HCT: 28.8 % — ABNORMAL LOW (ref 39.0–52.0)
HEMOGLOBIN: 9.9 g/dL — AB (ref 13.0–17.0)

## 2016-11-05 LAB — FIBRINOGEN: Fibrinogen: 115 mg/dL — ABNORMAL LOW (ref 210–475)

## 2016-11-05 LAB — MRSA PCR SCREENING: MRSA BY PCR: NEGATIVE

## 2016-11-05 MED ORDER — PANTOPRAZOLE SODIUM 40 MG IV SOLR
40.0000 mg | Freq: Two times a day (BID) | INTRAVENOUS | Status: DC
  Administered 2016-11-05 – 2016-11-12 (×14): 40 mg via INTRAVENOUS
  Filled 2016-11-05 (×15): qty 40

## 2016-11-05 MED ORDER — VITAMIN K1 10 MG/ML IJ SOLN
10.0000 mg | Freq: Once | INTRAVENOUS | Status: AC
  Administered 2016-11-05: 10 mg via INTRAVENOUS
  Filled 2016-11-05: qty 1

## 2016-11-05 MED ORDER — SODIUM CHLORIDE 0.9 % IV SOLN
0.0000 ug/min | INTRAVENOUS | Status: DC
  Filled 2016-11-05: qty 1

## 2016-11-05 MED ORDER — MAGNESIUM SULFATE 4 GM/100ML IV SOLN
4.0000 g | Freq: Once | INTRAVENOUS | Status: AC
  Administered 2016-11-05: 4 g via INTRAVENOUS
  Filled 2016-11-05: qty 100

## 2016-11-05 MED ORDER — SODIUM CHLORIDE 0.9 % IV SOLN
Freq: Once | INTRAVENOUS | Status: AC
  Administered 2016-11-05: 19:00:00 via INTRAVENOUS

## 2016-11-05 MED ORDER — WHITE PETROLATUM GEL
Status: AC
  Administered 2016-11-05: 1
  Filled 2016-11-05: qty 1

## 2016-11-05 NOTE — Procedures (Signed)
Extubation Procedure Note  Patient Details:   Name: Aristides Luckey DOB: Sep 14, 1965 MRN: [id number]   Airway Documentation:     Evaluation  O2 sats: stable throughout Complications: No apparent complications Patient did tolerate procedure well. Bilateral Breath Sounds: Clear, Diminished   Yes  Katheren Shams 11/05/2016, 10:04 AM

## 2016-11-05 NOTE — Progress Notes (Signed)
Notified Dr. Carlota Raspberry at bedside of lactic acid 2.2 result.

## 2016-11-05 NOTE — Progress Notes (Signed)
..   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 found to have bleeding esophageal varices s/p banding x 7 s/p TIPS by IR now intubated and transferred to ICU  SIGNIFICANT EVENTS  Transfused a total of 7 units of PRBCs 3 Plts + 2 FFP S/p EGD with variceal banding s/p TIPS  STUDIES:  CT Abdomen with contrast EGD IR TIPS  SUBJECTIVE: on vent, no bleeding  VITAL SIGNS: Temp:  [97 F (36.1 C)-99.4 F (37.4 C)] 98.4 F (36.9 C) (09/20 0358) Pulse Rate:  [73-128] 88 (09/20 0800) Resp:  [14-92] 92 (09/20 0724) BP: (95-143)/(59-92) 109/66 (09/20 0800) SpO2:  [97 %-100 %] 99 % (09/20 0800) Arterial Line BP: (91-151)/(46-80) 94/49 (09/20 0800) FiO2 (%):  [40 %-100 %] 40 % (09/20 0724) Weight:  [59 kg (130 lb)-65.7 kg (144 lb 13.5 oz)] 65.7 kg (144 lb 13.5 oz) (09/20 0403)  PHYSICAL EXAMINATION: General: sedated light Neuro: perr, rass - 2, FC yes HEENT: jvd wnl, ett PULM: coarse, reduced CV:  s1 s2 rrr WU:JWJX, BS low, no r Extremities: min edema    Recent Labs Lab 11/04/16 0143  11/04/16 1459 11/04/16 2028 11/05/16 0330  NA 139  < > 142 139 139  K 4.4  < > 4.8 3.6 4.0  CL 112*  < > 112* 115* 118*  CO2 23  --   --  19* 19*  BUN 18  < > 21* 17 17  CREATININE 0.95  < > 0.80 0.83 0.78  GLUCOSE 140*  < > 96 145* 143*  < > = values in this interval not displayed.  Recent Labs Lab 11/04/16 1900 11/04/16 2028 11/04/16 2108 11/05/16 0330  HGB 10.8* 11.9*  --  10.1*  HCT 31.8* 34.8*  --  28.4*  WBC 6.8 8.4  --  6.4  PLT 59* 65* 77* 52*   Dg Chest Port 1 View  Result Date: 11/04/2016 CLINICAL DATA:  Intubation, status post TIPS EXAM: PORTABLE CHEST 1 VIEW COMPARISON:  10/16/2016 FINDINGS: Endotracheal tube is 3.4 cm above the carina. Low lung  volumes with mild central vascular congestion and basilar atelectasis. No large effusion or pneumothorax. No focal pneumonia or significant collapse. TIPS stent noted in the right upper quadrant. IMPRESSION: Endotracheal tube 3.4 cm above the carina Low lung volumes with vascular congestion and basilar atelectasis Electronically Signed   By: Judie Petit.  Shick M.D.   On: 11/04/2016 20:04    EGD 9/19 Findings : - Patient with actively bleeding esophageal and possible actively bleeding gastric varices. - 800 mL of fresh blood was suctioned from the stomach but still I was not able to visualize fundus to rule out actively bleeding gastric varices. - 7 bands were placed on very large esophageal varices  CT ABDOMEN/PELVIS IMPRESSION: 1. Stigmata of advanced cirrhosis with severe portal hypertension, most notable for splenomegaly and large gastric and esophageal varices, as discussed above. No aggressive appearing hepatic lesion is noted on today's examination to suggest hepatocellular carcinoma at this time. 2. Moderate to large volume of ascites. Importantly, there is widespread peritoneal thickening and enhancement which may suggest peritonitis or malignancy. Correlation with paracentesis is suggested for fluid analysis. 3. Subtle areas of wedge-shaped hypoperfusion in both kidneys. This is nonspecific, but could be seen in the setting of pyelonephritis.  Correlation with urinalysis is recommended.  ASSESSMENT / PLAN:  NEURO: Sedation: with Propofol ggt RASS-3 Lactulose when able, high risk for hepatic encpeh rifax when able Prop off, WUA  CARDIAC: Hypovolemia secondary to Gastrointestinal hemorrhage Currently not on any vasopressors -was on neo, now off -low threshold albumin, not required now -MAP 60 goal - cvp pending  PULMONARY: Respiratory acidosis Intubated on Mechanical ventilation -ABg last reviewed, repeat ABG on current MV -weaning is goal -limit gross pos balance with some  hilar prominence, repeat pcx r in am   ID: Spontaneous Bacterial Peritonitis prophylaxis indicated -ctx -if repeat para, send cultures  Endocrine: At risk hypoglycemia No H/o insulin dependence Pt continues NPO ICU Glycemic protocol Q 4 ISS  GI: Hepatic cirrhosis with ascites, Chronic Hepatitis B MELD 21 S/p acute TIPS f/u GI and IR recommendations -NPO Lactulose when able  UGIB with Esophageal varices Remains NPO No OGT/NGT insertion>  Heme: Upper GI Bleed secondary to Esophageal Varices Anemia secondary to Acute GI blood loss  Pt has received a total of 7 units PRBCs 2 FFPs and 3 Plts ..B POS  Low fibrinogen low plts INR 1-2.07 Consumptive coagulopathy no schistocytes seen on initial smear at 4 pm no active bleeding, cbc to q12h coags improved, repeat in am  Vit K redose IV  RENAL Acid base abnormality: Respiratory Acidosis with metabolic acidosis NAGMA most likely hyperchloremic metabolic acidosis Avoid saline Use 1/2 NS when needed No role bicarb for now Chem in am  No resus fludis, use products when needed supp mag  Ccm time 40 min   Mcarthur Rossetti. Tyson Alias, MD, FACP Pgr: [phone] [facility]

## 2016-11-05 NOTE — Progress Notes (Addendum)
CRITICAL CARE UPDATE   Patient more altered from baseline Having active Lower Gi bleed. Receiving  FFP per E-link Spoke with Wife and we discussed his decline in his mental status and his clinical condition She understands that he has a poor prognosis  She wants to honor his wishes and just make him comfortable. She would like their two children to come and see him. Code Status updated: DNR  She would like to wait until AM to initiate comfort measures and Palliative consult Informed ELink   .Marland KitchenSigned Dr Newell [facility]

## 2016-11-05 NOTE — Progress Notes (Signed)
Pt BP decreased from 140's to 80's systolic. MAP currently <65. Turned off propofol. Pt is unresponsive, pupils fixed and pin-point. Pt urine output red, has bright red emesis when suctioned orally. Attempted to used nailbed stimulation to elicit pain response, no responsive and nailbed subsequently began bleeding. Notified ELink MD Kasa, received orders for neosynephrine and a stat head CT.

## 2016-11-05 NOTE — Progress Notes (Signed)
30 min post propofol off, pt now responsive to voice, nodding to questions, moving purposefully. BP improved, 110/54 (71). Pupils 4mm round and reactive. Notified ELink, head CT cancelled.

## 2016-11-05 NOTE — Progress Notes (Signed)
[facility] Progress Note  Chad Ramos 51 y.o. 1966-01-12  CC:  Variceal bleeding   Subjective: Patient underwent EGD with the placement of 7 bands yesterday followed by interventional radiology guided TIPS . No further bleeding episodes. Hemoglobin relatively stable.  ROS : Not able to obtain. Patient remains intubated   Objective: Vital signs in last 24 hours: Vitals:   11/05/16 0724 11/05/16 0800  BP: 107/69 109/66  Pulse: 80 88  Resp: (!) 92   Temp:    SpO2: 100% 99%    Physical Exam:  General:  Remains intubated. Responds to verbal, on.   Head:  Normocephalic, without obvious abnormality, atraumatic  Eyes:  , EOM's intact,   Lungs:   Coarse breath sounds   Heart:  Regular rate and rhythm, S1, S2 normal  Abdomen:   Mild distended, non-tender, bowel sounds active all four quadrants,  no masses, no peritoneal signs   Extremities: Extremities normal, atraumatic, no  edema  Pulses: 2+ and symmetric    Lab Results:  Recent Labs  11/04/16 2028 11/05/16 0330  NA 139 139  K 3.6 4.0  CL 115* 118*  CO2 19* 19*  GLUCOSE 145* 143*  BUN 17 17  CREATININE 0.83 0.78  CALCIUM 7.1* 6.9*  MG  --  1.5*  PHOS  --  3.5    Recent Labs  11/04/16 0143 11/04/16 2028  AST 32 50*  ALT 24 31  ALKPHOS 48 42  BILITOT 0.9 5.1*  PROT 3.9* 4.3*  ALBUMIN 1.7* 2.5*    Recent Labs  11/04/16 0143  11/04/16 2028 11/04/16 2108 11/05/16 0330  WBC 4.9  < > 8.4  --  6.4  NEUTROABS 3.4  --   --   --   --   HGB 7.8*  < > 11.9*  --  10.1*  HCT 23.0*  < > 34.8*  --  28.4*  MCV 93.1  < > 86.8  --  85.0  PLT 35*  < > 65* 77* 52*  < > = values in this interval not displayed.  Recent Labs  11/04/16 1618 11/04/16 2108  LABPROT 23.2* 19.7*  INR 2.07 1.69      Assessment/Plan: - Variceal bleed. 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 MELD score 16 on admission - Chroinc hepatitis B. No previous  treatment. He was seen by infectious disease earlier this month. He has been referred to liver care for further management. - Abnormal CT scan 10/16/2016  Showing widespread peritoneal thickening and enhancement which may suggest peritonitis or malignancy  Recommendations ------------------------ - Okay to extubate from GI standpoint. - Change Protonix drip to IV twice a day PPI. - Continue octreotide drip for total 72 hours. - Continue antibiotics while in the hospital. - Diagnostic paracentesis once acute issues are resolved. - Recommend liver transplant evaluation once acute issues are resolved as an outpatient basis. This was discussed with patient's wife. He will also need treatment for his chronic hepatitis B. - GI will follow  Kathi Der MD, FACP 11/05/2016, 8:37 AM  Pager [phone]  If no answer or after 5 PM call [phone]

## 2016-11-05 NOTE — Progress Notes (Signed)
Notified Elink Sedonia Small RN, Dr Sherene Sires unavailable at this time. Patient is more lethargic and incontinent of dark red stools.

## 2016-11-05 NOTE — Progress Notes (Signed)
Pt was extubated per Dr. Margo Common. Pt followed simple commands, positive leak test. Pt placed on 4L [place] were 100%. RT decreased his O2 to 3L [place] SATS 98%. Pt's HR 96, rr 16, BP 111/70. Pt was able to speak. 1300 on IS. RT will continue to monitor.

## 2016-11-05 NOTE — Progress Notes (Signed)
eLink Physician-Brief Progress Note Patient Name: Trevis Eden DOB: [birth year] MRN: [id number]   Date of Service  11/05/2016  HPI/Events of Note  Liver cirrhosis with GIB, low BP  eICU Interventions  Will keep MAP on lower end at 55 with neo     Intervention Category Evaluation Type: Other  Masato Pettie 11/05/2016, 1:20 AM

## 2016-11-05 NOTE — Progress Notes (Signed)
eLink Physician-Brief Progress Note Patient Name: Rashid Whitenight DOB: [birth year] MRN: [id number]   Date of Service  11/05/2016  HPI/Events of Note  Continues with dark red stools on octreotide/ ppi    Lab Results  Component Value Date   HGB 9.8 (L) 11/05/2016   HGB 9.9 (L) 11/05/2016   HGB 10.1 (L) 11/05/2016     Lab Results  Component Value Date   PLT 47 (L) 11/05/2016   PLT 47 (L) 11/05/2016   PLT 52 (L) 11/05/2016     Lab Results  Component Value Date   INR 2.03 11/05/2016   INR 1.69 11/04/2016   INR 2.07 11/04/2016     eICU Interventions   FFP x 2 units, monitor bp/pulse/ stool output     Intervention Category Major Interventions: Hemorrhage - evaluation and management  Sandrea Hughs 11/05/2016, 6:50 PM

## 2016-11-05 NOTE — Progress Notes (Signed)
eLink Physician-Brief Progress Note Patient Name: Chad Ramos DOB: 04/21/1965 MRN: [id number]   Date of Service  11/05/2016  HPI/Events of Note  hgb 11.9, LA 3.6  eICU Interventions  Multiple blood products given, would wait for patient to compensate and re-check LA      Intervention Category Evaluation Type: Other  Erin Fulling 11/05/2016, 12:37 AM

## 2016-11-05 NOTE — Progress Notes (Signed)
Referring Physician(s): Dr. Otis Brace  Supervising Physician: Chad Ramos  Patient Status:  [facility] - In-pt  Chief Complaint:  Upper GI Bleed S/P Emergent TIPS by Dr.  Earleen Ramos 11/04/2016  Subjective:  Chad Ramos is awake. He doesn't open his eyes. Minimally conversive, but does answer my questions appropriately with a nod.  He was extubated around 10 am.  He denies any further bleeding.   Allergies: Patient has no known allergies.  Medications: Prior to Admission medications   Medication Sig Start Date End Date Taking? Authorizing Provider  Multiple Vitamins-Minerals (MULTIVITAMIN WITH MINERALS) tablet Take 1 tablet by mouth daily.   Yes [provider]     Vital Signs: BP 109/64   Pulse 93   Temp 98.4 F (36.9 C) (Oral)   Resp (!) 92   Ht 5' 3"  (1.6 m)   Wt 144 lb 13.5 oz (65.7 kg)   SpO2 100%   BMI 25.66 kg/m   Physical Exam Awake, eyes closed Answers appropriately. Right neck stick ok, no bleeding. RUQ stick site with small amount of blood on the dressing. Abdomen soft, NTND.  Imaging: Ir Tips  Result Date: 11/05/2016 CLINICAL DATA:  51 year old male with cirrhosis, portal hypertension, and emergent, life-threatening hemorrhage of esophageal varices EXAM: TRANSJUGULAR INTRAHEPATIC PORTOSYSTEMIC SHUNT ULTRASOUND-GUIDED PARACENTESIS MEDICATIONS: As antibiotic prophylaxis, 2 g Ancef IV was ordered pre-procedure and administered intravenously within one hour of incision. One unit of platelets was also administered IV during the procedure. ANESTHESIA/SEDATION: General - as administered by the Anesthesia department CONTRAST:  137m ISOVUE-300 IOPAMIDOL (ISOVUE-300) INJECTION 61% FLUOROSCOPY TIME:  Fluoroscopy Time: 32 minutes 48 seconds (229.3 mGy). COMPLICATIONS: None PROCEDURE: Informed written consent was obtained from the patient's family after a thorough discussion of the procedural risks, benefits and alternatives. Specific risks discussed with  TIPS/variceal embolization included: Bleeding, infection, vascular injury, need for further procedure/surgery, renal injury/renal failure, contrast reaction, non-target embolization, liver dysfunction/failure, hepatic encephalopathy, stroke (~1%), cardiopulmonary collapse, death. All questions were addressed. Maximal Sterile Barrier Technique was utilized including caps, mask, sterile gowns, sterile gloves, sterile drape, hand hygiene and skin antiseptic. A timeout was performed prior to the initiation of the procedure. Operators: Dr. WEarleen Ramos Dr. HVernard GamblesPatient was positioned supine position on the table, with the right upper quadrant and the right neck prepped and draped in the usual sterile fashion. Ultrasound survey of the right upper quadrant was performed, with images stored and sent to PACs. Safe-T-Centesis catheter was introduced to initiate paracentesis. Using ultrasound guidance, Chiba needle was then used access the right portal system. Once we confirmed position in the portal system with portal venography, micro wire was advanced into the inferior mesenteric vein. The inner dilator was advanced over the metal stiffener into the portal system, with the inner dilator advanced over the wire. Venogram confirmed are location within the portal system. The micro wire was then replaced into the inner dilator, with the transition point position at the apex of the catheter. Check flow vial was placed on the back end of the inner dilator. Ultrasound survey was then performed of the right neck, with images stored and sent to PACs. Ultrasound guidance was then used to access the right internal jugular vein with a micro puncture kit. The wire was advanced under fluoroscopy into the right atrium, and a small incision was made. The needle was removed and the dilator was placed. The micro wire in the stiffener were removed and an 035 wire was then passed into the inferior vena cava. Ten  French soft tissue dilation was  performed on the wire, and then 10 [place] TIPS sheath was placed. Combination of Benson wire and multipurpose angled catheter were used to select the right hepatic vein. Venogram confirmed location within the right hepatic vein. Tip sheath was then advanced over the catheter with a coaxial Amplatz wire, for a position cephalad to the transition point of the wire in the portal venous system. Simultaneous venogram of the portal system and the hepatic venous system was performed both in a frontal projection and a right anterior oblique projection. The Rosch-Uchida needle set was then advanced through the sheath. Using frontal and oblique projections, approximately 5-10 passes were required to access the portal venous system. Access into the portal venous system was confirmed by aspirating portal venous blood and a venogram. Britta Mccreedy wire was then passed through the catheter into the inferior mesenteric vein. Needle was removed and disposed. Pigtail marking catheter was then advanced over the wire for portal venogram confirming location. A 62m x 68mViatorr was selected. Sequential balloon angioplasty was performed with 44m50mnd then 6 mm balloon angioplasty of the soft tissue tract. Sheath was advanced over the deflating balloon into the portal venous system. Once the sheath was in place within the portal system, the selected covered stent was advanced through the sheath over Amplatz wire. Once the stent had been deliver through the sheath, sheath was withdrawn, and the cover stent was delivered. Post balloon angioplasty to 10 mm was performed. Pigtail catheter was then advanced to the portal system with flush angiogram performed. Given that the coronary vein was entirely reversed with hepatopetal flow, empiric embolization was not performed of the varices system. Gel-Foam embolization was then performed along the soft tissue tract of the portal access. Peritoneal pigtail catheter was then removed. All sheaths wires  catheters were removed and hemostasis was achieved with manual pressure. Sterile bandage was placed. Patient was transported stable to the ICU. No significant blood loss.  No complications encountered. IMPRESSION: Status post TIPS, with 10 mm diameter covered stent deployed. Empiric embolization of the varices system was not performed, as flow was reversed after the shunt deployment, with hepatopetal flow. Patient will be admitted to the hospitalist service for overnight observation. Outpatient follow-up with a TIPS ultrasound in 1 month. Signed, Chad FannyagEarleen NewportO [facility] Electronically Signed   By: JaiCorrie MckusickO.   On: 11/05/2016 08:34   Dg Chest Port 1 View  Result Date: 11/04/2016 CLINICAL DATA:  Intubation, status post TIPS EXAM: PORTABLE CHEST 1 VIEW COMPARISON:  10/16/2016 FINDINGS: Endotracheal tube is 3.4 cm above the carina. Low lung volumes with mild central vascular congestion and basilar atelectasis. No large effusion or pneumothorax. No focal pneumonia or significant collapse. TIPS stent noted in the right upper quadrant. IMPRESSION: Endotracheal tube 3.4 cm above the carina Low lung volumes with vascular congestion and basilar atelectasis Electronically Signed   By: M. Jerilynn MagesShick M.D.   On: 11/04/2016 20:04    Labs:  CBC:  Recent Labs  11/04/16 1900 11/04/16 2028 11/04/16 2108 11/05/16 0330 11/05/16 1119 11/05/16 1154  WBC 6.8 8.4  --  6.4  --  5.2  HGB 10.8* 11.9*  --  10.1* 9.9* 9.8*  HCT 31.8* 34.8*  --  28.4* 28.8* 28.3*  PLT 59* 65* 77* 52* 47* 47*    COAGS:  Recent Labs  11/04/16 0143 11/04/16 1618 11/04/16 2108 11/05/16 1119  INR 2.32 2.07 1.69 2.03  APTT  --  39* 38* 40*    BMP:  Recent Labs  10/27/16 1519 11/04/16 0143 11/04/16 0150 11/04/16 1459 11/04/16 2028 11/05/16 0330  NA 138 139 142 142 139 139  K 3.9 4.4 4.4 4.8 3.6 4.0  CL 107 112* 111 112* 115* 118*  CO2 26 23  --   --   19* 19*  GLUCOSE 123* 140* 132* 96 145* 143*  BUN 14 18 18  21* 17 17  CALCIUM 8.1* 7.3*  --   --  7.1* 6.9*  CREATININE 0.91 0.95 0.90 0.80 0.83 0.78  GFRNONAA 97 >60  --   --  >60 >60  GFRAA 112 >60  --   --  >60 >60    LIVER FUNCTION TESTS:  Recent Labs  10/07/16 1443 10/27/16 1519 11/04/16 0143 11/04/16 2028  BILITOT 1.0 1.0 0.9 5.1*  AST 54* 40* 32 50*  ALT 40 32 24 31  ALKPHOS 95  --  48 42  PROT 6.9 6.9 3.9* 4.3*  ALBUMIN 2.8*  --  1.7* 2.5*    Assessment and Plan:  GI bleeding from esophogeal varices.  S/P urgent TIPS by Dr.Wagner 11/04/2016.  Doing better. Extubated earlier this morning.  Will arrange f/u in our clinic for routine doppler imaging of the TIPS to ensure patency.  Electronically Signed: Murrell Redden, PA-C 11/05/2016, 1:28 PM   I spent a total of 15 Minutes at the the patient's bedside AND on the patient's hospital floor or unit, greater than 50% of which was counseling/coordinating care for f/u after TIPS

## 2016-11-05 NOTE — Anesthesia Preprocedure Evaluation (Signed)
Anesthesia Evaluation  Patient identified by MRN, date of birth, ID band Patient awake    Reviewed: Allergy & Precautions, NPO status , Patient's Chart, lab work & pertinent test results  History of Anesthesia Complications Negative for: history of anesthetic complications  Airway Mallampati: Intubated       Dental  (+) Teeth Intact   Pulmonary neg pulmonary ROS,    breath sounds clear to auscultation       Cardiovascular negative cardio ROS   Rhythm:Regular Rate:Normal     Neuro/Psych negative neurological ROS     GI/Hepatic (+) Hepatitis -, BPossible variceal bleed   Endo/Other  negative endocrine ROS  Renal/GU negative Renal ROS     Musculoskeletal   Abdominal   Peds  Hematology  (+) anemia , Pancyopenia   Anesthesia Other Findings Active GI bleed. Taken from Endo to PACU to IR for TIPS  Reproductive/Obstetrics                             Anesthesia Physical Anesthesia Plan  ASA: IV and emergent  Anesthesia Plan: General   Post-op Pain Management:    Induction: Inhalational  PONV Risk Score and Plan: 2 and Treatment may vary due to age or medical condition  Airway Management Planned: Oral ETT  Additional Equipment: Arterial line  Intra-op Plan:   Post-operative Plan: Post-operative intubation/ventilation  Informed Consent: I have reviewed the patients History and Physical, chart, labs and discussed the procedure including the risks, benefits and alternatives for the proposed anesthesia with the patient or authorized representative who has indicated his/her understanding and acceptance.   Dental advisory given  Plan Discussed with: CRNA and Surgeon  Anesthesia Plan Comments:         Anesthesia Quick Evaluation

## 2016-11-05 NOTE — Anesthesia Postprocedure Evaluation (Signed)
Anesthesia Post Note  Patient: Chad Ramos  Procedure(s) Performed: Procedure(s) (LRB): RADIOLOGY WITH ANESTHESIA (N/A)     Patient location during evaluation: ICU Anesthesia Type: General Level of consciousness: responds to stimulation and patient remains intubated per anesthesia plan Pain management: pain level controlled Vital Signs Assessment: post-procedure vital signs reviewed and stable Respiratory status: patient on ventilator - see flowsheet for VS and patient remains intubated per anesthesia plan Cardiovascular status: blood pressure returned to baseline and stable Postop Assessment: no apparent nausea or vomiting Anesthetic complications: no Comments: Pt recovering now that GI bleed and coagulopathy are resolving    Last Vitals:  Vitals:   11/05/16 0724 11/05/16 0800  BP: 107/69 109/66  Pulse: 80 88  Resp: (!) 92   Temp:    SpO2: 100% 99%    Last Pain:  Vitals:   11/05/16 0358  TempSrc: Oral  PainSc:                  Chad Ramos,E. Jathen Sudano

## 2016-11-05 NOTE — Progress Notes (Signed)
CRITICAL VALUE ALERT  Critical Value:  Troponin 0.07  Date & Time Notied:  11/05/16 0535  Provider Notified: Pola Corn, Dr. Belia Heman  Orders Received/Actions taken: none given at this time.

## 2016-11-05 NOTE — Progress Notes (Signed)
  Echocardiogram 2D Echocardiogram has been performed.  Chad Ramos 11/05/2016, 11:30 AM

## 2016-11-06 ENCOUNTER — Inpatient Hospital Stay (HOSPITAL_COMMUNITY): Payer: BLUE CROSS/BLUE SHIELD

## 2016-11-06 DIAGNOSIS — Z01818 Encounter for other preprocedural examination: Secondary | ICD-10-CM

## 2016-11-06 DIAGNOSIS — K7682 Hepatic encephalopathy: Secondary | ICD-10-CM

## 2016-11-06 DIAGNOSIS — K7200 Acute and subacute hepatic failure without coma: Secondary | ICD-10-CM

## 2016-11-06 LAB — CBC WITH DIFFERENTIAL/PLATELET
BASOS ABS: 0 10*3/uL (ref 0.0–0.1)
Basophils Relative: 0 %
EOS PCT: 1 %
Eosinophils Absolute: 0 10*3/uL (ref 0.0–0.7)
HEMATOCRIT: 29.1 % — AB (ref 39.0–52.0)
Hemoglobin: 10.2 g/dL — ABNORMAL LOW (ref 13.0–17.0)
LYMPHS ABS: 0.8 10*3/uL (ref 0.7–4.0)
LYMPHS PCT: 16 %
MCH: 30.1 pg (ref 26.0–34.0)
MCHC: 35.1 g/dL (ref 30.0–36.0)
MCV: 85.8 fL (ref 78.0–100.0)
MONO ABS: 0.4 10*3/uL (ref 0.1–1.0)
MONOS PCT: 8 %
Neutro Abs: 3.9 10*3/uL (ref 1.7–7.7)
Neutrophils Relative %: 76 %
PLATELETS: 41 10*3/uL — AB (ref 150–400)
RBC: 3.39 MIL/uL — ABNORMAL LOW (ref 4.22–5.81)
RDW: 16.1 % — AB (ref 11.5–15.5)
WBC: 5.1 10*3/uL (ref 4.0–10.5)

## 2016-11-06 LAB — PREPARE FRESH FROZEN PLASMA
UNIT DIVISION: 0
Unit division: 0

## 2016-11-06 LAB — BLOOD CULTURE ID PANEL (REFLEXED)
Acinetobacter baumannii: NOT DETECTED
CANDIDA GLABRATA: NOT DETECTED
CANDIDA KRUSEI: NOT DETECTED
CARBAPENEM RESISTANCE: NOT DETECTED
Candida albicans: NOT DETECTED
Candida parapsilosis: NOT DETECTED
Candida tropicalis: NOT DETECTED
ESCHERICHIA COLI: NOT DETECTED
Enterobacter cloacae complex: NOT DETECTED
Enterobacteriaceae species: NOT DETECTED
Enterococcus species: NOT DETECTED
Haemophilus influenzae: NOT DETECTED
KLEBSIELLA OXYTOCA: NOT DETECTED
KLEBSIELLA PNEUMONIAE: NOT DETECTED
LISTERIA MONOCYTOGENES: NOT DETECTED
Methicillin resistance: NOT DETECTED
Neisseria meningitidis: NOT DETECTED
Proteus species: NOT DETECTED
Pseudomonas aeruginosa: NOT DETECTED
SERRATIA MARCESCENS: NOT DETECTED
STAPHYLOCOCCUS SPECIES: DETECTED — AB
STREPTOCOCCUS PNEUMONIAE: NOT DETECTED
STREPTOCOCCUS SPECIES: NOT DETECTED
Staphylococcus aureus (BCID): NOT DETECTED
Streptococcus agalactiae: NOT DETECTED
Streptococcus pyogenes: NOT DETECTED
Vancomycin resistance: NOT DETECTED

## 2016-11-06 LAB — BPAM FFP
Blood Product Expiration Date: 201809252359
Blood Product Expiration Date: 201809252359
ISSUE DATE / TIME: 201809202233
ISSUE DATE / TIME: 201809202130
UNIT TYPE AND RH: 7300
Unit Type and Rh: 7300

## 2016-11-06 LAB — POCT I-STAT 3, ART BLOOD GAS (G3+)
Acid-Base Excess: 1 mmol/L (ref 0.0–2.0)
BICARBONATE: 23.7 mmol/L (ref 20.0–28.0)
O2 Saturation: 99 %
PO2 ART: 141 mmHg — AB (ref 83.0–108.0)
TCO2: 25 mmol/L (ref 22–32)
pCO2 arterial: 31.2 mmHg — ABNORMAL LOW (ref 32.0–48.0)
pH, Arterial: 7.49 — ABNORMAL HIGH (ref 7.350–7.450)

## 2016-11-06 LAB — GLUCOSE, CAPILLARY
GLUCOSE-CAPILLARY: 125 mg/dL — AB (ref 65–99)
GLUCOSE-CAPILLARY: 144 mg/dL — AB (ref 65–99)
Glucose-Capillary: 117 mg/dL — ABNORMAL HIGH (ref 65–99)
Glucose-Capillary: 120 mg/dL — ABNORMAL HIGH (ref 65–99)
Glucose-Capillary: 129 mg/dL — ABNORMAL HIGH (ref 65–99)

## 2016-11-06 LAB — HEMOGLOBIN AND HEMATOCRIT, BLOOD
HEMATOCRIT: 33.8 % — AB (ref 39.0–52.0)
HEMOGLOBIN: 11.4 g/dL — AB (ref 13.0–17.0)

## 2016-11-06 LAB — AMMONIA: Ammonia: 94 umol/L — ABNORMAL HIGH (ref 9–35)

## 2016-11-06 LAB — COMPREHENSIVE METABOLIC PANEL
ALBUMIN: 2.3 g/dL — AB (ref 3.5–5.0)
ALT: 51 U/L (ref 17–63)
AST: 81 U/L — AB (ref 15–41)
Alkaline Phosphatase: 50 U/L (ref 38–126)
Anion gap: 4 — ABNORMAL LOW (ref 5–15)
BILIRUBIN TOTAL: 2.5 mg/dL — AB (ref 0.3–1.2)
BUN: 16 mg/dL (ref 6–20)
CO2: 24 mmol/L (ref 22–32)
Calcium: 7.4 mg/dL — ABNORMAL LOW (ref 8.9–10.3)
Chloride: 114 mmol/L — ABNORMAL HIGH (ref 101–111)
Creatinine, Ser: 0.64 mg/dL (ref 0.61–1.24)
GFR calc Af Amer: >60 mL/min (ref >60)
GFR calc non Af Amer: >60 mL/min (ref >60)
GLUCOSE: 130 mg/dL — AB (ref 65–99)
POTASSIUM: 3.6 mmol/L (ref 3.5–5.1)
SODIUM: 142 mmol/L (ref 135–145)
TOTAL PROTEIN: 4.4 g/dL — AB (ref 6.5–8.1)

## 2016-11-06 LAB — TRIGLYCERIDES: TRIGLYCERIDES: 68 mg/dL (ref <150)

## 2016-11-06 MED ORDER — PROPOFOL 1000 MG/100ML IV EMUL
10.0000 ug/kg/min | INTRAVENOUS | Status: DC
  Filled 2016-11-06: qty 100

## 2016-11-06 MED ORDER — LACTULOSE ENEMA
300.0000 mL | Freq: Four times a day (QID) | ORAL | Status: DC
  Administered 2016-11-06 – 2016-11-07 (×3): 300 mL via RECTAL
  Filled 2016-11-06 (×6): qty 300

## 2016-11-06 MED ORDER — MORPHINE SULFATE (PF) 2 MG/ML IV SOLN
2.0000 mg | INTRAVENOUS | Status: DC | PRN
  Administered 2016-11-07: 2 mg via INTRAVENOUS
  Filled 2016-11-06: qty 1

## 2016-11-06 MED ORDER — ROCURONIUM BROMIDE 50 MG/5ML IV SOLN
0.8000 mg/kg | Freq: Once | INTRAVENOUS | Status: AC
  Administered 2016-11-06: 51.1 mg via INTRAVENOUS

## 2016-11-06 MED ORDER — LORAZEPAM 2 MG/ML IJ SOLN
2.0000 mg | INTRAMUSCULAR | Status: DC | PRN
  Administered 2016-11-06 – 2016-11-07 (×2): 2 mg via INTRAVENOUS
  Filled 2016-11-06 (×2): qty 1

## 2016-11-06 MED ORDER — PROPOFOL 1000 MG/100ML IV EMUL
5.0000 ug/kg/min | INTRAVENOUS | Status: DC
  Administered 2016-11-06: 30 ug/kg/min via INTRAVENOUS
  Administered 2016-11-07: 20 ug/kg/min via INTRAVENOUS
  Administered 2016-11-08: 5 ug/kg/min via INTRAVENOUS
  Administered 2016-11-08: 15 ug/kg/min via INTRAVENOUS
  Administered 2016-11-09: 20 ug/kg/min via INTRAVENOUS
  Administered 2016-11-09: 25 ug/kg/min via INTRAVENOUS
  Administered 2016-11-10: 20 ug/kg/min via INTRAVENOUS
  Administered 2016-11-10: 5 ug/kg/min via INTRAVENOUS
  Administered 2016-11-11: 30 ug/kg/min via INTRAVENOUS
  Filled 2016-11-06 (×5): qty 100
  Filled 2016-11-06: qty 1000
  Filled 2016-11-06 (×2): qty 100

## 2016-11-06 MED ORDER — FENTANYL CITRATE (PF) 100 MCG/2ML IJ SOLN: 50.0000 ug | INTRAMUSCULAR | Status: DC | PRN

## 2016-11-06 MED ORDER — PROPOFOL 1000 MG/100ML IV EMUL
INTRAVENOUS | Status: AC
  Filled 2016-11-06: qty 100

## 2016-11-06 NOTE — Progress Notes (Signed)
[facility] Gastroenterology Progress Note  Chad Ramos 51 y.o. 1965-07-18  CC:  Variceal bleeding   Subjective:  Patient currently obtunded. Yesterday's events noted. Apparently patient was doing fine in the early afternoon after extubation. Sequelae started noticing more bleeding episodes along with altered mental status.  ROS : Not able to obtain.    Objective: Vital signs in last 24 hours: Vitals:   11/06/16 0815 11/06/16 0900  BP:  123/78  Pulse:  88  Resp:    Temp: 97.9 F (36.6 C)   SpO2:  97%    Physical Exam:  General:  Patient currently obtunded. Not following, hands.   Head:  Normocephalic, without obvious abnormality, atraumatic     Lungs:   Occasional rhonchi but no respiratory distress   Heart:  Regular rate and rhythm, S1, S2 normal  Abdomen:   Soft, non-tender, bowel sounds active all four quadrants,  no masses, no peritoneal signs   Extremities: Extremities normal, atraumatic, no  edema       Lab Results:  Recent Labs  11/05/16 0330 11/06/16 0345  NA 139 142  K 4.0 3.6  CL 118* 114*  CO2 19* 24  GLUCOSE 143* 130*  BUN 17 16  CREATININE 0.78 0.64  CALCIUM 6.9* 7.4*  MG 1.5*  --   PHOS 3.5  --     Recent Labs  11/04/16 2028 11/06/16 0345  AST 50* 81*  ALT 31 51  ALKPHOS 42 50  BILITOT 5.1* 2.5*  PROT 4.3* 4.4*  ALBUMIN 2.5* 2.3*    Recent Labs  11/05/16 1154 11/06/16 0345  WBC 5.2 5.1  NEUTROABS 3.8 3.9  HGB 9.8* 10.2*  HCT 28.3* 29.1*  MCV 85.5 85.8  PLT 47* 41*    Recent Labs  11/04/16 2108 11/05/16 1119  LABPROT 19.7* 22.8*  INR 1.69 2.03      Assessment/Plan: - Variceal bleed. 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 MELD score 16 on admission - Hepatic encephalopathy - Chroinc hepatitis B. No previous treatment. He was seen by infectious disease earlier this month. He has been referred to liver care for further management. - Abnormal CT scan  10/16/2016  Showing widespread peritoneal thickening and enhancement which may suggest peritonitis or malignancy  Recommendations ------------------------ - Long discussion with wife at bedside in presence of critical care attending. - Family wants to keep him DNR and DNI  - Recommend starting lactulose enema every 6 hours for hepatic encephalopathy. Not able to take any oral intake because of altered mental status. I would hold off on NG tube given recent variceal band placement. - His hemoglobin has remained stable but his INR is trending up. - Continue octreotide drip and antibiotics. - Continue supportive care for now. Prognosis remains poor.  Kathi Der MD, FACP 11/06/2016, 9:52 AM  Pager [phone]  If no answer or after 5 PM call [phone]

## 2016-11-06 NOTE — Progress Notes (Signed)
eLink Physician-Brief Progress Note Patient Name: Chad Ramos DOB: May 03, 1965 MRN: [id number]   Date of Service  11/06/2016  HPI/Events of Note  Intensivist notified me that patient obtunded and likely needs intubation for airway protection as family are now requesting full code status. Family made aware of risk of adverse events with intubation and even possibly death. They are ok with risks. Updated by intensivist regarding current clinical state.  eICU Interventions  1. Transferring to ICU 2. Plan for endotracheal intubation upon arrival 3. Ordering Cardiac Telemetry      Intervention Category Major Interventions: Other:  Lawanda Cousins 11/06/2016, 7:49 PM

## 2016-11-06 NOTE — Progress Notes (Signed)
Order for transfer to 2M02. Family informed and report called and given to RN. Belongings packed and patient transferred off unit.

## 2016-11-06 NOTE — Progress Notes (Signed)
eLink Physician-Brief Progress Note Patient Name: Chad Ramos DOB: 10/21/1965 MRN: [id number]   Date of Service  11/06/2016  HPI/Events of Note  Contacted by bedside nurse. Patient transferred out of the ICU today. Currently DO NOT RESUSCITATE CODE STATUS. Reportedly family are considering reversal of CODE STATUS. Patient still having maroon/bloody stools.   eICU Interventions  Intensivist notified to discuss CODE STATUS with family.     Intervention Category Major Interventions: Other:  Lawanda Cousins 11/06/2016, 7:29 PM

## 2016-11-06 NOTE — Progress Notes (Addendum)
-   case discussed with [facility] liver transplant team Hepatologist Dr. Cornelia Copa for further management of ongoing bleeding, Encephalopathy and for possible transfer for liver transplant. - According to Dr. Huston Foley patient is not a candidate for transfer at this time because of ongoing active  bleeding , and he  may not be a candidate for liver transplant until etiology of his encephalopathy is determined and treated.  - recommended discussion with interventional radiology regarding portal pressure measurement.  - case discussed with interventional radiologist Dr. Fredia Sorrow  And Dr. Loreta Ave. According to them, there was significant reduction in portal preparation as as well as reversal of the flow into the portal system after placement of TIPS. - According to Dr. Loreta Ave, they can consider embolization of varices if there is suspicious for ongoing variceal bleed but they prefer EGD to localize a bleeding source.  - Discuss with the wife at bedside regarding need for repeat EGD if he continues to have drop in hemoglobin. She declined repeat EGD. She also declined any escalation of care.  - Continue supportive care for now.  - patient remains critically ill. GI will follow      Kathi Der MD, FACP 11/06/2016, 2:31 PM  Pager [phone]  If no answer or after 5 PM call [phone]

## 2016-11-06 NOTE — Progress Notes (Signed)
..   Name: Chad Ramos MRN: [id number] DOB: 11/16/1965    ADMISSION DATE:  11/04/2016 CONSULTATION DATE: 11/04/16  REFERRING MD :  Alessandra Bevels MD  CHIEF COMPLAINT:  Hematemesis  BRIEF PATIENT DESCRIPTION: 51 yr old male with chronic Liver cirrhosis secondary to Hep B found to have bleeding esophageal varices s/p banding x 7 s/p TIPS by IR now intubated and transferred to ICU  SIGNIFICANT EVENTS  Transfused a total of 7 units of PRBCs 3 Plts + 2 FFP S/p EGD with variceal banding s/p TIPS  STUDIES:  CT Abdomen with contrast EGD IR TIPS  SUBJECTIVE:  Much worse clinical status, aspirated?, change in MS Bleeding  VITAL SIGNS: Temp:  [97.6 F (36.4 C)-98.4 F (36.9 C)] 97.9 F (36.6 C) (09/21 0815) Pulse Rate:  [71-103] 79 (09/21 0800) Resp:  [14-21] 14 (09/20 2333) BP: (91-125)/(50-81) 119/74 (09/21 0800) SpO2:  [97 %-100 %] 97 % (09/21 0800) Arterial Line BP: (97-139)/(51-81) 131/71 (09/21 0800) FiO2 (%):  [40 %] 40 % (09/20 0910) Weight:  [63.9 kg (140 lb 14 oz)] 63.9 kg (140 lb 14 oz) (09/21 0311)  PHYSICAL EXAMINATION: General: not awake, int agonal Neuro: not awake, not fc HEENT: jvd down, no stridor PULM: mild ronchi CV: s1 s2 RRR WE:XHBZ, BS low, no r Extremities:  No edema     Recent Labs Lab 11/04/16 2028 11/05/16 0330 11/06/16 0345  NA 139 139 142  K 3.6 4.0 3.6  CL 115* 118* 114*  CO2 19* 19* 24  BUN _0 CREATININE 0.83 0.78 0.64  GLUCOSE 145* 143* 130*    Recent Labs Lab 11/05/16 0330 11/05/16 1119 11/05/16 1154 11/06/16 0345  HGB 10.1* 9.9* 9.8* 10.2*  HCT 28.4* 28.8* 28.3* 29.1*  WBC 6.4  --  5.2 5.1  PLT 52* 47* 47* 41*   Ir Tips  Result Date: 11/05/2016 CLINICAL DATA:  51 year old male with cirrhosis, portal hypertension, and emergent, life-threatening hemorrhage of esophageal varices EXAM: TRANSJUGULAR INTRAHEPATIC PORTOSYSTEMIC SHUNT ULTRASOUND-GUIDED PARACENTESIS MEDICATIONS: As antibiotic prophylaxis, 2 g Ancef IV was  ordered pre-procedure and administered intravenously within one hour of incision. One unit of platelets was also administered IV during the procedure. ANESTHESIA/SEDATION: General - as administered by the Anesthesia department CONTRAST:  157m ISOVUE-300 IOPAMIDOL (ISOVUE-300) INJECTION 61% FLUOROSCOPY TIME:  Fluoroscopy Time: 32 minutes 48 seconds (229.3 mGy). COMPLICATIONS: None PROCEDURE: Informed written consent was obtained from the patient's family after a thorough discussion of the procedural risks, benefits and alternatives. Specific risks discussed with TIPS/variceal embolization included: Bleeding, infection, vascular injury, need for further procedure/surgery, renal injury/renal failure, contrast reaction, non-target embolization, liver dysfunction/failure, hepatic encephalopathy, stroke (~1%), cardiopulmonary collapse, death. All questions were addressed. Maximal Sterile Barrier Technique was utilized including caps, mask, sterile gowns, sterile gloves, sterile drape, hand hygiene and skin antiseptic. A timeout was performed prior to the initiation of the procedure. Operators: Dr. WEarleen Newport Dr. HVernard GamblesPatient was positioned supine position on the table, with the right upper quadrant and the right neck prepped and draped in the usual sterile fashion. Ultrasound survey of the right upper quadrant was performed, with images stored and sent to PACs. Safe-T-Centesis catheter was introduced to initiate paracentesis. Using ultrasound guidance, Chiba needle was then used access the right portal system. Once we confirmed position in the portal system with portal venography, micro wire was advanced into the inferior mesenteric vein. The inner dilator was advanced over the metal stiffener into the portal system, with the inner dilator advanced over the wire. Venogram  confirmed are location within the portal system. The micro wire was then replaced into the inner dilator, with the transition point position at the  apex of the catheter. Check flow vial was placed on the back end of the inner dilator. Ultrasound survey was then performed of the right neck, with images stored and sent to PACs. Ultrasound guidance was then used to access the right internal jugular vein with a micro puncture kit. The wire was advanced under fluoroscopy into the right atrium, and a small incision was made. The needle was removed and the dilator was placed. The micro wire in the stiffener were removed and an 035 wire was then passed into the inferior vena cava. Ten French soft tissue dilation was performed on the wire, and then 10 [place] TIPS sheath was placed. Combination of Benson wire and multipurpose angled catheter were used to select the right hepatic vein. Venogram confirmed location within the right hepatic vein. Tip sheath was then advanced over the catheter with a coaxial Amplatz wire, for a position cephalad to the transition point of the wire in the portal venous system. Simultaneous venogram of the portal system and the hepatic venous system was performed both in a frontal projection and a right anterior oblique projection. The Rosch-Uchida needle set was then advanced through the sheath. Using frontal and oblique projections, approximately 5-10 passes were required to access the portal venous system. Access into the portal venous system was confirmed by aspirating portal venous blood and a venogram. Britta Mccreedy wire was then passed through the catheter into the inferior mesenteric vein. Needle was removed and disposed. Pigtail marking catheter was then advanced over the wire for portal venogram confirming location. A 58m x 63mViatorr was selected. Sequential balloon angioplasty was performed with 43m43mnd then 6 mm balloon angioplasty of the soft tissue tract. Sheath was advanced over the deflating balloon into the portal venous system. Once the sheath was in place within the portal system, the selected covered stent was advanced through  the sheath over Amplatz wire. Once the stent had been deliver through the sheath, sheath was withdrawn, and the cover stent was delivered. Post balloon angioplasty to 10 mm was performed. Pigtail catheter was then advanced to the portal system with flush angiogram performed. Given that the coronary vein was entirely reversed with hepatopetal flow, empiric embolization was not performed of the varices system. Gel-Foam embolization was then performed along the soft tissue tract of the portal access. Peritoneal pigtail catheter was then removed. All sheaths wires catheters were removed and hemostasis was achieved with manual pressure. Sterile bandage was placed. Patient was transported stable to the ICU. No significant blood loss.  No complications encountered. IMPRESSION: Status post TIPS, with 10 mm diameter covered stent deployed. Empiric embolization of the varices system was not performed, as flow was reversed after the shunt deployment, with hepatopetal flow. Patient will be admitted to the hospitalist service for overnight observation. Outpatient follow-up with a TIPS ultrasound in 1 month. Signed, JaiDulcy FannyagEarleen NewportO [facility] GreThe [facility] Institute Of St. Louisdiology Electronically Signed   By: JaiCorrie MckusickO.   On: 11/05/2016 08:34   Dg Chest Port 1 View  Result Date: 11/06/2016 CLINICAL DATA:  Respiratory acidosis. EXAM: PORTABLE CHEST 1 VIEW COMPARISON:  11/04/2016. FINDINGS: Interim extubation. Cardiomegaly. Pulmonary venous congestion Diffuse bilateral pulmonary interstitial prominence, right side greater than left, progressed from prior exam. Findings suggest CHF. Right base pneumonia cannot be excluded . Small right pleural effusion. No pneumothorax IMPRESSION: Findings  consistent congestive heart failure with pulmonary interstitial edema and small right pleural effusion. Right base pneumonia cannot be excluded . Electronically Signed   By: Marcello Moores  Register   On: 11/06/2016  06:50   Dg Chest Port 1 View  Result Date: 11/04/2016 CLINICAL DATA:  Intubation, status post TIPS EXAM: PORTABLE CHEST 1 VIEW COMPARISON:  10/16/2016 FINDINGS: Endotracheal tube is 3.4 cm above the carina. Low lung volumes with mild central vascular congestion and basilar atelectasis. No large effusion or pneumothorax. No focal pneumonia or significant collapse. TIPS stent noted in the right upper quadrant. IMPRESSION: Endotracheal tube 3.4 cm above the carina Low lung volumes with vascular congestion and basilar atelectasis Electronically Signed   By: Jerilynn Mages.  Shick M.D.   On: 11/04/2016 20:04   Ir Paracentesis  Result Date: 11/05/2016 CLINICAL DATA:  51 year old male with cirrhosis, portal hypertension, and emergent, life-threatening hemorrhage of esophageal varices EXAM: TRANSJUGULAR INTRAHEPATIC PORTOSYSTEMIC SHUNT ULTRASOUND-GUIDED PARACENTESIS MEDICATIONS: As antibiotic prophylaxis, 2 g Ancef IV was ordered pre-procedure and administered intravenously within one hour of incision. One unit of platelets was also administered IV during the procedure. ANESTHESIA/SEDATION: General - as administered by the Anesthesia department CONTRAST:  116m ISOVUE-300 IOPAMIDOL (ISOVUE-300) INJECTION 61% FLUOROSCOPY TIME:  Fluoroscopy Time: 32 minutes 48 seconds (229.3 mGy). COMPLICATIONS: None PROCEDURE: Informed written consent was obtained from the patient's family after a thorough discussion of the procedural risks, benefits and alternatives. Specific risks discussed with TIPS/variceal embolization included: Bleeding, infection, vascular injury, need for further procedure/surgery, renal injury/renal failure, contrast reaction, non-target embolization, liver dysfunction/failure, hepatic encephalopathy, stroke (~1%), cardiopulmonary collapse, death. All questions were addressed. Maximal Sterile Barrier Technique was utilized including caps, mask, sterile gowns, sterile gloves, sterile drape, hand hygiene and skin  antiseptic. A timeout was performed prior to the initiation of the procedure. Operators: Dr. WEarleen Newport Dr. HVernard GamblesPatient was positioned supine position on the table, with the right upper quadrant and the right neck prepped and draped in the usual sterile fashion. Ultrasound survey of the right upper quadrant was performed, with images stored and sent to PACs. Safe-T-Centesis catheter was introduced to initiate paracentesis. Using ultrasound guidance, Chiba needle was then used access the right portal system. Once we confirmed position in the portal system with portal venography, micro wire was advanced into the inferior mesenteric vein. The inner dilator was advanced over the metal stiffener into the portal system, with the inner dilator advanced over the wire. Venogram confirmed are location within the portal system. The micro wire was then replaced into the inner dilator, with the transition point position at the apex of the catheter. Check flow vial was placed on the back end of the inner dilator. Ultrasound survey was then performed of the right neck, with images stored and sent to PACs. Ultrasound guidance was then used to access the right internal jugular vein with a micro puncture kit. The wire was advanced under fluoroscopy into the right atrium, and a small incision was made. The needle was removed and the dilator was placed. The micro wire in the stiffener were removed and an 035 wire was then passed into the inferior vena cava. Ten French soft tissue dilation was performed on the wire, and then 10 [place] sheath was placed. Combination of Benson wire and multipurpose angled catheter were used to select the right hepatic vein. Venogram confirmed location within the right hepatic vein. Tip sheath was then advanced over the catheter with a coaxial Amplatz wire, for a position cephalad to the transition point of  the wire in the portal venous system. Simultaneous venogram of the portal system and the  hepatic venous system was performed both in a frontal projection and a right anterior oblique projection. The Rosch-Uchida needle set was then advanced through the sheath. Using frontal and oblique projections, approximately 5-10 passes were required to access the portal venous system. Access into the portal venous system was confirmed by aspirating portal venous blood and a venogram. Britta Mccreedy wire was then passed through the catheter into the inferior mesenteric vein. Needle was removed and disposed. Pigtail marking catheter was then advanced over the wire for portal venogram confirming location. A 87m x 663mViatorr was selected. Sequential balloon angioplasty was performed with 17m68mnd then 6 mm balloon angioplasty of the soft tissue tract. Sheath was advanced over the deflating balloon into the portal venous system. Once the sheath was in place within the portal system, the selected covered stent was advanced through the sheath over Amplatz wire. Once the stent had been deliver through the sheath, sheath was withdrawn, and the cover stent was delivered. Post balloon angioplasty to 10 mm was performed. Pigtail catheter was then advanced to the portal system with flush angiogram performed. Given that the coronary vein was entirely reversed with hepatopetal flow, empiric embolization was not performed of the varices system. Gel-Foam embolization was then performed along the soft tissue tract of the portal access. Peritoneal pigtail catheter was then removed. All sheaths wires catheters were removed and hemostasis was achieved with manual pressure. Sterile bandage was placed. Patient was transported stable to the ICU. No significant blood loss.  No complications encountered. IMPRESSION: Status post TIPS, with 10 mm diameter covered stent deployed. Empiric embolization of the varices system was not performed, as flow was reversed after the shunt deployment, with hepatopetal flow. Patient will be admitted to the  hospitalist service for overnight observation. Outpatient follow-up with a TIPS ultrasound in 1 month. Signed, JaiDulcy FannyagEarleen NewportO [facility] GreOsu Internal Medicine LLCdiology Electronically Signed   By: JaiCorrie MckusickO.   On: 11/05/2016 08:34    EGD 9/19 Findings : - Patient with actively bleeding esophageal and possible actively bleeding gastric varices. - 800 mL of fresh blood was suctioned from the stomach but still I was not able to visualize fundus to rule out actively bleeding gastric varices. - 7 bands were placed on very large esophageal varices  CT ABDOMEN/PELVIS IMPRESSION: 1. Stigmata of advanced cirrhosis with severe portal hypertension, most notable for splenomegaly and large gastric and esophageal varices, as discussed above. No aggressive appearing hepatic lesion is noted on today's examination to suggest hepatocellular carcinoma at this time. 2. Moderate to large volume of ascites. Importantly, there is widespread peritoneal thickening and enhancement which may suggest peritonitis or malignancy. Correlation with paracentesis is suggested for fluid analysis. 3. Subtle areas of wedge-shaped hypoperfusion in both kidneys. This is nonspecific, but could be seen in the setting of pyelonephritis. Correlation with urinalysis is recommended.  ASSESSMENT / PLAN:   Patient Active Problem List   Diagnosis Date Noted  . Acute blood loss anemia 11/04/2016  . UGIB (upper gastrointestinal bleed) 11/04/2016  . GI bleed 11/04/2016  . Secondary esophageal varices with bleeding (HCCValdese . Chronic viral hepatitis B without delta-agent (HCCGlen[place]9/12/2016  . Cirrhosis of liver due to hepatitis B (HCCPastoria9/12/2016  . Pancytopenia (HCCDacoma8/22/2018   PLAN:  Extensive d/ wife at bedside: much worse clinical status He likely aspirated, bleeding old vs new, sever enceph He  did not want ett in first place We have maxed any efforts for bleeding with tips Wife does  not want ANy escallation, DNR, DNI Can maintain medical care for now Add lactulose enema Unable to take oral rifax Cbc to follow He is NOT in distress, if worsen would need morphine drip and full comfort and she agrees D./w GI also in room CTX maintain   Ccm time 30 min   Lavon Paganini. Titus Mould, MD, [place] Pgr: [facility]

## 2016-11-06 NOTE — Progress Notes (Addendum)
..   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 MELD>21  On 9/20.18 Pt's wife made patient DNR and wanted no aggressive measures. Pt was downgraded. We were asked to come see the patient as wife has changed her mind. She is accompanied by a friend who is a physician and When I asked what prompted the decision the friend became aggressive and does not believe the objective evidence stating that it can not be true. The wife says she wants to try everything for her daughter to have her father. Pt is being transferred to 60M ICU. Obtunded and not responsive secondary to acute hepatic encephalopathy.   VITAL SIGNS: Temp:  [97.6 F (36.4 C)-98.3 F (36.8 C)] 98.1 F (36.7 C) (09/21 1506) Pulse Rate:  [71-103] 90 (09/21 1800) Resp:  [14-18] 14 (09/20 2333) BP: (93-149)/(54-90) 119/80 (09/21 1800) SpO2:  [95 %-100 %] 98 % (09/21 1800) Arterial Line BP: (105-137)/(58-76) 137/76 (09/21 0900) Weight:  [63.9 kg (140 lb 14 oz)] 63.9 kg (140 lb 14 oz) (09/21 0311)   Recent Labs Lab 11/04/16 2028 11/05/16 0330 11/06/16 0345  NA 139 139 142  K 3.6 4.0 3.6  CL 115* 118* 114*  CO2 19* 19* 24  BUN CREATININE 0.83 0.78 0.64  GLUCOSE 145* 143* 130*    Recent Labs Lab 11/05/16 0330 11/05/16 1119 11/05/16 1154 11/06/16 0345  HGB 10.1* 9.9* 9.8* 10.2*  HCT 28.4* 28.8* 28.3* 29.1*  WBC 6.4  --  5.2 5.1  PLT 52* 47* 47* 41*   Dg Chest Port 1 View  Result Date: 11/06/2016 CLINICAL DATA:  Respiratory acidosis. EXAM: PORTABLE CHEST 1 VIEW COMPARISON:  11/04/2016. FINDINGS: Interim extubation. Cardiomegaly. Pulmonary venous congestion Diffuse bilateral  pulmonary interstitial prominence, right side greater than left, progressed from prior exam. Findings suggest CHF. Right base pneumonia cannot be excluded . Small right pleural effusion. No pneumothorax IMPRESSION: Findings consistent congestive heart failure with pulmonary interstitial edema and small right pleural effusion. Right base pneumonia cannot be excluded . Electronically Signed   By: Maisie Fus  Register   On: 11/06/2016 06:50   Plan: Acute Hepatic Encephalopathy Obtunded secondary to hyperammonemia Unable to give PO lactulose or rifax, rectal enema of lactulose  may not be as effective in setting of acute bleeding High Risk of aspiration Will intubate patient upon arrival to 60M- CXR post intubation Will send Ammonia level, CBC and coags  Acute GI Bleed Esophageal varices banded x 7  S/p TIPS per IR documentation there was good decompression with significant decrease in portal pressures As well as reversal of flow into the portal system post TIPS repeat EGD to localize bleeding was recommended  but at the time patient's wife declined She is now agreeable to the procedure GI did discuss with [facility] Hepatologist who stated patient was not a candidate for transfer with ongoing bleeding  In my prior conversations with the wife she has said to me that the patient did not want to be intubated or sustained on life support. I attempted to explain in detail to the wife the patient's prognosis and his clinical condition  I was unable to do so as  I was interrupted by her family friend who is a hospitalist and states that the wife has given her POA. There was no legal documentation given to support this. The friend states that she does not believe the objective evidence within our discussion (his ammonia level and MELD score, etc.).  The wife did not express an understanding that this will not change his prognosis but stated she wanted to keep him alive for his daughter.  There is no advanced directive  or living will to my knowledge.  I explained the risks associated with intubation and she states that she understands   CODE STATUS: FULL CODE FAMILY: NEXT OF KIN IS WIFE HAS 2 CHILDREN CCM Time: 45 mins   Pulmonary and Critical Care Medicine [facility] Pager: [phone]  11/06/2016, 8:25 PM\

## 2016-11-06 NOTE — Progress Notes (Signed)
Patient ID: Chad Ramos, male   DOB: [birth year], 51 y.o.   MRN: [id number]    Referring Physician(s): Dr. Otis Brace  Supervising Physician: Aletta Edouard  Patient Status: [facility] - In-pt  Chief Complaint: UGI bleed  Subjective: Patient obtunded today.  Had some dark blood overnight.  Allergies: Patient has no known allergies.  Medications: Prior to Admission medications   Medication Sig Start Date End Date Taking? Authorizing Provider  Multiple Vitamins-Minerals (MULTIVITAMIN WITH MINERALS) tablet Take 1 tablet by mouth daily.   Yes [provider]    Vital Signs: BP 122/78   Pulse 83   Temp 98.3 F (36.8 C) (Axillary)   Resp 14   Ht 5' 3"  (1.6 m)   Wt 140 lb 14 oz (63.9 kg)   SpO2 97%   BMI 24.95 kg/m   Physical Exam: Neck: stick site c/d/i  Imaging: Ir Tips  Result Date: 11/05/2016 CLINICAL DATA:  51 year old male with cirrhosis, portal hypertension, and emergent, life-threatening hemorrhage of esophageal varices EXAM: TRANSJUGULAR INTRAHEPATIC PORTOSYSTEMIC SHUNT ULTRASOUND-GUIDED PARACENTESIS MEDICATIONS: As antibiotic prophylaxis, 2 g Ancef IV was ordered pre-procedure and administered intravenously within one hour of incision. One unit of platelets was also administered IV during the procedure. ANESTHESIA/SEDATION: General - as administered by the Anesthesia department CONTRAST:  167m ISOVUE-300 IOPAMIDOL (ISOVUE-300) INJECTION 61% FLUOROSCOPY TIME:  Fluoroscopy Time: 32 minutes 48 seconds (229.3 mGy). COMPLICATIONS: None PROCEDURE: Informed written consent was obtained from the patient's family after a thorough discussion of the procedural risks, benefits and alternatives. Specific risks discussed with TIPS/variceal embolization included: Bleeding, infection, vascular injury, need for further procedure/surgery, renal injury/renal failure, contrast reaction, non-target embolization, liver dysfunction/failure, hepatic encephalopathy, stroke (~1%),  cardiopulmonary collapse, death. All questions were addressed. Maximal Sterile Barrier Technique was utilized including caps, mask, sterile gowns, sterile gloves, sterile drape, hand hygiene and skin antiseptic. A timeout was performed prior to the initiation of the procedure. Operators: Dr. WEarleen Newport Dr. HVernard GamblesPatient was positioned supine position on the table, with the right upper quadrant and the right neck prepped and draped in the usual sterile fashion. Ultrasound survey of the right upper quadrant was performed, with images stored and sent to PACs. Safe-T-Centesis catheter was introduced to initiate paracentesis. Using ultrasound guidance, Chiba needle was then used access the right portal system. Once we confirmed position in the portal system with portal venography, micro wire was advanced into the inferior mesenteric vein. The inner dilator was advanced over the metal stiffener into the portal system, with the inner dilator advanced over the wire. Venogram confirmed are location within the portal system. The micro wire was then replaced into the inner dilator, with the transition point position at the apex of the catheter. Check flow vial was placed on the back end of the inner dilator. Ultrasound survey was then performed of the right neck, with images stored and sent to PACs. Ultrasound guidance was then used to access the right internal jugular vein with a micro puncture kit. The wire was advanced under fluoroscopy into the right atrium, and a small incision was made. The needle was removed and the dilator was placed. The micro wire in the stiffener were removed and an 035 wire was then passed into the inferior vena cava. Ten French soft tissue dilation was performed on the wire, and then 10 [place] sheath was placed. Combination of Benson wire and multipurpose angled catheter were used to select the right hepatic vein. Venogram confirmed location within the right hepatic vein. Tip sheath was  then  advanced over the catheter with a coaxial Amplatz wire, for a position cephalad to the transition point of the wire in the portal venous system. Simultaneous venogram of the portal system and the hepatic venous system was performed both in a frontal projection and a right anterior oblique projection. The Rosch-Uchida needle set was then advanced through the sheath. Using frontal and oblique projections, approximately 5-10 passes were required to access the portal venous system. Access into the portal venous system was confirmed by aspirating portal venous blood and a venogram. Britta Mccreedy wire was then passed through the catheter into the inferior mesenteric vein. Needle was removed and disposed. Pigtail marking catheter was then advanced over the wire for portal venogram confirming location. A 32m x 632mViatorr was selected. Sequential balloon angioplasty was performed with 24m58mnd then 6 mm balloon angioplasty of the soft tissue tract. Sheath was advanced over the deflating balloon into the portal venous system. Once the sheath was in place within the portal system, the selected covered stent was advanced through the sheath over Amplatz wire. Once the stent had been deliver through the sheath, sheath was withdrawn, and the cover stent was delivered. Post balloon angioplasty to 10 mm was performed. Pigtail catheter was then advanced to the portal system with flush angiogram performed. Given that the coronary vein was entirely reversed with hepatopetal flow, empiric embolization was not performed of the varices system. Gel-Foam embolization was then performed along the soft tissue tract of the portal access. Peritoneal pigtail catheter was then removed. All sheaths wires catheters were removed and hemostasis was achieved with manual pressure. Sterile bandage was placed. Patient was transported stable to the ICU. No significant blood loss.  No complications encountered. IMPRESSION: Status post TIPS, with 10 mm diameter  covered stent deployed. Empiric embolization of the varices system was not performed, as flow was reversed after the shunt deployment, with hepatopetal flow. Patient will be admitted to the hospitalist service for overnight observation. Outpatient follow-up with a TIPS ultrasound in 1 month. Signed, JaiDulcy FannyagEarleen NewportO [facility] Electronically Signed   By: JaiCorrie MckusickO.   On: 11/05/2016 08:34   Dg Chest Port 1 View  Result Date: 11/06/2016 CLINICAL DATA:  Respiratory acidosis. EXAM: PORTABLE CHEST 1 VIEW COMPARISON:  11/04/2016. FINDINGS: Interim extubation. Cardiomegaly. Pulmonary venous congestion Diffuse bilateral pulmonary interstitial prominence, right side greater than left, progressed from prior exam. Findings suggest CHF. Right base pneumonia cannot be excluded . Small right pleural effusion. No pneumothorax IMPRESSION: Findings consistent congestive heart failure with pulmonary interstitial edema and small right pleural effusion. Right base pneumonia cannot be excluded . Electronically Signed   By: ThoMarcello Mooresegister   On: 11/06/2016 06:50   Dg Chest Port 1 View  Result Date: 11/04/2016 CLINICAL DATA:  Intubation, status post TIPS EXAM: PORTABLE CHEST 1 VIEW COMPARISON:  10/16/2016 FINDINGS: Endotracheal tube is 3.4 cm above the carina. Low lung volumes with mild central vascular congestion and basilar atelectasis. No large effusion or pneumothorax. No focal pneumonia or significant collapse. TIPS stent noted in the right upper quadrant. IMPRESSION: Endotracheal tube 3.4 cm above the carina Low lung volumes with vascular congestion and basilar atelectasis Electronically Signed   By: M. Jerilynn MagesShick M.D.   On: 11/04/2016 20:04   Ir Paracentesis  Result Date: 11/05/2016 CLINICAL DATA:  [age]ar old male with cirrhosis, portal hypertension, and emergent, life-threatening hemorrhage of esophageal varices EXAM: TRANSJUGULAR INTRAHEPATIC  PORTOSYSTEMIC SHUNT ULTRASOUND-GUIDED PARACENTESIS MEDICATIONS: As antibiotic  prophylaxis, 2 g Ancef IV was ordered pre-procedure and administered intravenously within one hour of incision. One unit of platelets was also administered IV during the procedure. ANESTHESIA/SEDATION: General - as administered by the Anesthesia department CONTRAST:  158m ISOVUE-300 IOPAMIDOL (ISOVUE-300) INJECTION 61% FLUOROSCOPY TIME:  Fluoroscopy Time: 32 minutes 48 seconds (229.3 mGy). COMPLICATIONS: None PROCEDURE: Informed written consent was obtained from the patient's family after a thorough discussion of the procedural risks, benefits and alternatives. Specific risks discussed with TIPS/variceal embolization included: Bleeding, infection, vascular injury, need for further procedure/surgery, renal injury/renal failure, contrast reaction, non-target embolization, liver dysfunction/failure, hepatic encephalopathy, stroke (~1%), cardiopulmonary collapse, death. All questions were addressed. Maximal Sterile Barrier Technique was utilized including caps, mask, sterile gowns, sterile gloves, sterile drape, hand hygiene and skin antiseptic. A timeout was performed prior to the initiation of the procedure. Operators: Dr. WEarleen Newport Dr. HVernard GamblesPatient was positioned supine position on the table, with the right upper quadrant and the right neck prepped and draped in the usual sterile fashion. Ultrasound survey of the right upper quadrant was performed, with images stored and sent to PACs. Safe-T-Centesis catheter was introduced to initiate paracentesis. Using ultrasound guidance, Chiba needle was then used access the right portal system. Once we confirmed position in the portal system with portal venography, micro wire was advanced into the inferior mesenteric vein. The inner dilator was advanced over the metal stiffener into the portal system, with the inner dilator advanced over the wire. Venogram confirmed are location within the portal  system. The micro wire was then replaced into the inner dilator, with the transition point position at the apex of the catheter. Check flow vial was placed on the back end of the inner dilator. Ultrasound survey was then performed of the right neck, with images stored and sent to PACs. Ultrasound guidance was then used to access the right internal jugular vein with a micro puncture kit. The wire was advanced under fluoroscopy into the right atrium, and a small incision was made. The needle was removed and the dilator was placed. The micro wire in the stiffener were removed and an 035 wire was then passed into the inferior vena cava. Ten French soft tissue dilation was performed on the wire, and then 10 [place]IPS sheath was placed. Combination of Benson wire and multipurpose angled catheter were used to select the right hepatic vein. Venogram confirmed location within the right hepatic vein. Tip sheath was then advanced over the catheter with a coaxial Amplatz wire, for a position cephalad to the transition point of the wire in the portal venous system. Simultaneous venogram of the portal system and the hepatic venous system was performed both in a frontal projection and a right anterior oblique projection. The Rosch-Uchida needle set was then advanced through the sheath. Using frontal and oblique projections, approximately 5-10 passes were required to access the portal venous system. Access into the portal venous system was confirmed by aspirating portal venous blood and a venogram. BBritta Mccreedywire was then passed through the catheter into the inferior mesenteric vein. Needle was removed and disposed. Pigtail marking catheter was then advanced over the wire for portal venogram confirming location. A 149mx 6064miatorr was selected. Sequential balloon angioplasty was performed with 4mm72md then 6 mm balloon angioplasty of the soft tissue tract. Sheath was advanced over the deflating balloon into the portal venous  system. Once the sheath was in place within the portal system, the selected covered stent was advanced through the sheath over Amplatz  wire. Once the stent had been deliver through the sheath, sheath was withdrawn, and the cover stent was delivered. Post balloon angioplasty to 10 mm was performed. Pigtail catheter was then advanced to the portal system with flush angiogram performed. Given that the coronary vein was entirely reversed with hepatopetal flow, empiric embolization was not performed of the varices system. Gel-Foam embolization was then performed along the soft tissue tract of the portal access. Peritoneal pigtail catheter was then removed. All sheaths wires catheters were removed and hemostasis was achieved with manual pressure. Sterile bandage was placed. Patient was transported stable to the ICU. No significant blood loss.  No complications encountered. IMPRESSION: Status post TIPS, with 10 mm diameter covered stent deployed. Empiric embolization of the varices system was not performed, as flow was reversed after the shunt deployment, with hepatopetal flow. Patient will be admitted to the hospitalist service for overnight observation. Outpatient follow-up with a TIPS ultrasound in 1 month. Signed, Dulcy Fanny. Earleen Newport, DO [facility] Electronically Signed   By: Corrie Mckusick D.O.   On: 11/05/2016 08:34    Labs:  CBC:  Recent Labs  11/04/16 2028  11/05/16 0330 11/05/16 1119 11/05/16 1154 11/06/16 0345  WBC 8.4  --  6.4  --  5.2 5.1  HGB 11.9*  --  10.1* 9.9* 9.8* 10.2*  HCT 34.8*  --  28.4* 28.8* 28.3* 29.1*  PLT 65*  < > 52* 47* 47* 41*  < > = values in this interval not displayed.  COAGS:  Recent Labs  11/04/16 0143 11/04/16 1618 11/04/16 2108 11/05/16 1119  INR 2.32 2.07 1.69 2.03  APTT  --  39* 38* 40*    BMP:  Recent Labs  11/04/16 0143  11/04/16 1459 11/04/16 2028 11/05/16 0330 11/06/16 0345  NA 139  < >  142 139 139 142  K 4.4  < > 4.8 3.6 4.0 3.6  CL 112*  < > 112* 115* 118* 114*  CO2 23  --   --  19* 19* 24  GLUCOSE 140*  < > 96 145* 143* 130*  BUN 18  < > 21* 17 17 16   CALCIUM 7.3*  --   --  7.1* 6.9* 7.4*  CREATININE 0.95  < > 0.80 0.83 0.78 0.64  GFRNONAA >60  --   --  >60 >60 >60  GFRAA >60  --   --  >60 >60 >60  < > = values in this interval not displayed.  LIVER FUNCTION TESTS:  Recent Labs  10/07/16 1443 10/27/16 1519 11/04/16 0143 11/04/16 2028 11/06/16 0345  BILITOT 1.0 1.0 0.9 5.1* 2.5*  AST 54* 40* 32 50* 81*  ALT 40 32 24 31 51  ALKPHOS 95  --  48 42 50  PROT 6.9 6.9 3.9* 4.3* 4.4*  ALBUMIN 2.8*  --  1.7* 2.5* 2.3*    Assessment and Plan: 1. S/p emergent TIPS for UGI bleed  Patient obtunded today secondary to elevated ammonia level.  Unable to take POs due to mental status.  RN states they are going to try lactulose enema. hgb stable Will need outpt follow up with liver US etc.  Info sent to our office so they can contact the patient for follow up instructions later on.  Electronically Signed: Henreitta Cea 11/06/2016, 1:48 PM   I spent a total of 15 Minutes at the the patient's bedside AND on the patient's hospital floor or unit, greater than 50% of which was counseling/coordinating care  for UGI bleed

## 2016-11-06 NOTE — Care Management Note (Signed)
Case Management Note  Patient Details  Name: Chad Ramos MRN: [id number] Date of Birth: Apr 01, 1965  Subjective/Objective:    Pt admitted with hematemeis - recent dx of hep b                Action/Plan:  No family at bedside but per staff are supportive -  pt is obtunded.  Poor prognosis - CM will continue to follow for discharge needs    Expected Discharge Date:                  Expected Discharge Plan:     In-House Referral:     Discharge planning Services  CM Consult  Post Acute Care Choice:    Choice offered to:     DME Arranged:    DME Agency:     HH Arranged:    HH Agency:     Status of Service:     If discussed at [employer], dates discussed:    Additional Comments:  Cherylann Parr, RN 11/06/2016, 11:12 AM

## 2016-11-06 NOTE — Procedures (Signed)
Endotracheal Intubation Procedure Note  Indication for endotracheal intubation: potential airway compromise and altered mental status and increased risk for aspiration. Airway Assessment: Mallampati Class: I (soft palate, uvula, fauces, and tonsillar pillars visible). Sedation: propofol ggt. Paralytic: rocuronium. Lidocaine: no. Atropine: no. Equipment: Macintosh 3 laryngoscope blade and 7.80mm cuffed endotracheal tube. Cricoid Pressure: no. Number of attempts: 1. ETT location confirmed by by auscultation and ETCO2 monitor  CXR pending.  Chad Ramos 11/06/2016

## 2016-11-07 ENCOUNTER — Inpatient Hospital Stay (HOSPITAL_COMMUNITY): Payer: BLUE CROSS/BLUE SHIELD

## 2016-11-07 LAB — COMPREHENSIVE METABOLIC PANEL
ALBUMIN: 2.1 g/dL — AB (ref 3.5–5.0)
ALK PHOS: 51 U/L (ref 38–126)
ALT: 65 U/L — AB (ref 17–63)
ANION GAP: 4 — AB (ref 5–15)
AST: 101 U/L — ABNORMAL HIGH (ref 15–41)
BUN: 18 mg/dL (ref 6–20)
CALCIUM: 7.4 mg/dL — AB (ref 8.9–10.3)
CHLORIDE: 119 mmol/L — AB (ref 101–111)
CO2: 23 mmol/L (ref 22–32)
Creatinine, Ser: 0.72 mg/dL (ref 0.61–1.24)
GFR calc non Af Amer: >60 mL/min (ref >60)
GLUCOSE: 122 mg/dL — AB (ref 65–99)
Potassium: 2.9 mmol/L — ABNORMAL LOW (ref 3.5–5.1)
SODIUM: 146 mmol/L — AB (ref 135–145)
Total Bilirubin: 2.1 mg/dL — ABNORMAL HIGH (ref 0.3–1.2)
Total Protein: 4.3 g/dL — ABNORMAL LOW (ref 6.5–8.1)

## 2016-11-07 LAB — CULTURE, BLOOD (ROUTINE X 2): SPECIAL REQUESTS: ADEQUATE

## 2016-11-07 LAB — CBC WITH DIFFERENTIAL/PLATELET
BASOS ABS: 0 10*3/uL (ref 0.0–0.1)
BASOS PCT: 0 %
Basophils Absolute: 0 10*3/uL (ref 0.0–0.1)
Basophils Relative: 0 %
EOS ABS: 0.1 10*3/uL (ref 0.0–0.7)
EOS ABS: 0.1 10*3/uL (ref 0.0–0.7)
EOS PCT: 2 %
Eosinophils Relative: 2 %
HCT: 32.1 % — ABNORMAL LOW (ref 39.0–52.0)
HEMATOCRIT: 34.4 % — AB (ref 39.0–52.0)
HEMOGLOBIN: 10.8 g/dL — AB (ref 13.0–17.0)
Hemoglobin: 11.4 g/dL — ABNORMAL LOW (ref 13.0–17.0)
LYMPHS ABS: 1 10*3/uL (ref 0.7–4.0)
Lymphocytes Relative: 20 %
Lymphocytes Relative: 18 %
Lymphs Abs: 1.2 10*3/uL (ref 0.7–4.0)
MCH: 29.5 pg (ref 26.0–34.0)
MCH: 29.6 pg (ref 26.0–34.0)
MCHC: 33.6 g/dL (ref 30.0–36.0)
MCHC: 33.1 g/dL (ref 30.0–36.0)
MCV: 87.7 fL (ref 78.0–100.0)
MCV: 89.4 fL (ref 78.0–100.0)
MONO ABS: 0.4 10*3/uL (ref 0.1–1.0)
MONOS PCT: 8 %
Monocytes Absolute: 0.6 10*3/uL (ref 0.1–1.0)
Monocytes Relative: 10 %
NEUTROS PCT: 69 %
Neutro Abs: 4 10*3/uL (ref 1.7–7.7)
Neutro Abs: 4 10*3/uL (ref 1.7–7.7)
Neutrophils Relative %: 72 %
PLATELETS: 40 10*3/uL — AB (ref 150–400)
Platelets: 50 10*3/uL — ABNORMAL LOW (ref 150–400)
RBC: 3.66 MIL/uL — AB (ref 4.22–5.81)
RBC: 3.85 MIL/uL — ABNORMAL LOW (ref 4.22–5.81)
RDW: 16.5 % — ABNORMAL HIGH (ref 11.5–15.5)
RDW: 17 % — AB (ref 11.5–15.5)
WBC: 5.8 10*3/uL (ref 4.0–10.5)
WBC: 5.5 10*3/uL (ref 4.0–10.5)

## 2016-11-07 LAB — BASIC METABOLIC PANEL
ANION GAP: 6 (ref 5–15)
Anion gap: 3 — ABNORMAL LOW (ref 5–15)
BUN: 17 mg/dL (ref 6–20)
BUN: 16 mg/dL (ref 6–20)
CALCIUM: 7.5 mg/dL — AB (ref 8.9–10.3)
CO2: 19 mmol/L — AB (ref 22–32)
CO2: 20 mmol/L — ABNORMAL LOW (ref 22–32)
CREATININE: 0.72 mg/dL (ref 0.61–1.24)
CREATININE: 0.66 mg/dL (ref 0.61–1.24)
Calcium: 7.5 mg/dL — ABNORMAL LOW (ref 8.9–10.3)
Chloride: 122 mmol/L — ABNORMAL HIGH (ref 101–111)
Chloride: 125 mmol/L — ABNORMAL HIGH (ref 101–111)
GFR calc Af Amer: >60 mL/min (ref >60)
Glucose, Bld: 138 mg/dL — ABNORMAL HIGH (ref 65–99)
Glucose, Bld: 123 mg/dL — ABNORMAL HIGH (ref 65–99)
POTASSIUM: 3.2 mmol/L — AB (ref 3.5–5.1)
Potassium: 3.2 mmol/L — ABNORMAL LOW (ref 3.5–5.1)
SODIUM: 147 mmol/L — AB (ref 135–145)
Sodium: 148 mmol/L — ABNORMAL HIGH (ref 135–145)

## 2016-11-07 LAB — GLUCOSE, CAPILLARY
GLUCOSE-CAPILLARY: 118 mg/dL — AB (ref 65–99)
GLUCOSE-CAPILLARY: 122 mg/dL — AB (ref 65–99)
GLUCOSE-CAPILLARY: 118 mg/dL — AB (ref 65–99)
GLUCOSE-CAPILLARY: 119 mg/dL — AB (ref 65–99)
GLUCOSE-CAPILLARY: 115 mg/dL — AB (ref 65–99)

## 2016-11-07 LAB — PROTIME-INR
INR: 1.83
PROTHROMBIN TIME: 21 s — AB (ref 11.4–15.2)

## 2016-11-07 LAB — HEMOGLOBIN AND HEMATOCRIT, BLOOD
HCT: 31.9 % — ABNORMAL LOW (ref 39.0–52.0)
Hemoglobin: 10.5 g/dL — ABNORMAL LOW (ref 13.0–17.0)

## 2016-11-07 MED ORDER — CHLORHEXIDINE GLUCONATE 0.12% ORAL RINSE (MEDLINE KIT)
15.0000 mL | Freq: Two times a day (BID) | OROMUCOSAL | Status: DC
  Administered 2016-11-07 – 2016-11-12 (×10): 15 mL via OROMUCOSAL

## 2016-11-07 MED ORDER — RIFAXIMIN 550 MG PO TABS
550.0000 mg | ORAL_TABLET | Freq: Two times a day (BID) | ORAL | Status: DC
  Filled 2016-11-07: qty 1

## 2016-11-07 MED ORDER — SODIUM CHLORIDE 0.45 % IV SOLN
INTRAVENOUS | Status: DC
  Administered 2016-11-07: 11:00:00 via INTRAVENOUS

## 2016-11-07 MED ORDER — LACTULOSE 10 GM/15ML PO SOLN
30.0000 g | Freq: Four times a day (QID) | ORAL | Status: DC
  Filled 2016-11-07: qty 45

## 2016-11-07 MED ORDER — ORAL CARE MOUTH RINSE
15.0000 mL | Freq: Four times a day (QID) | OROMUCOSAL | Status: DC
  Administered 2016-11-07 – 2016-11-12 (×16): 15 mL via OROMUCOSAL

## 2016-11-07 MED ORDER — LACTULOSE 10 GM/15ML PO SOLN
30.0000 g | Freq: Four times a day (QID) | ORAL | Status: DC
  Administered 2016-11-07 – 2016-11-10 (×11): 30 g
  Filled 2016-11-07 (×13): qty 45

## 2016-11-07 MED ORDER — VITAMIN K1 10 MG/ML IJ SOLN
10.0000 mg | Freq: Once | INTRAVENOUS | Status: AC
  Administered 2016-11-07: 10 mg via INTRAVENOUS
  Filled 2016-11-07: qty 1

## 2016-11-07 MED ORDER — RIFAXIMIN 550 MG PO TABS
550.0000 mg | ORAL_TABLET | Freq: Two times a day (BID) | ORAL | Status: DC
  Administered 2016-11-07 – 2016-11-11 (×9): 550 mg
  Filled 2016-11-07 (×11): qty 1

## 2016-11-07 MED ORDER — POTASSIUM CHLORIDE 10 MEQ/100ML IV SOLN
10.0000 meq | INTRAVENOUS | Status: AC
  Administered 2016-11-07 (×8): 10 meq via INTRAVENOUS
  Filled 2016-11-07 (×7): qty 100

## 2016-11-07 NOTE — Progress Notes (Signed)
[facility] ADULT ICU REPLACEMENT PROTOCOL FOR AM LAB REPLACEMENT ONLY  The patient does apply for the [facility] Adult ICU Electrolyte Replacment Protocol based on the criteria listed below:   1. Is GFR >/= 40 ml/min? Yes.    Patient's GFR today is >60 2. Is urine output >/= 0.5 ml/kg/hr for the last 6 hours? Yes.   Patient's UOP is 1.0 ml/kg/hr 3. Is BUN < 60 mg/dL? Yes.    Patient's BUN today is 18 4. Abnormal electrolyte(s): K 2.9 5. Ordered repletion with: protocol 6. If a panic level lab has been reported, has the CCM MD in charge been notified? No..   Physician:    Markus Daft A 11/07/2016 4:09 AM

## 2016-11-07 NOTE — Progress Notes (Signed)
Initial Nutrition Assessment  DOCUMENTATION CODES:  Not applicable  INTERVENTION:  When/if medically stable, recommend initiation of TF via OGT with Vital AF 1.2 at goal rate of 55 ml/h (1320 ml per day) and Prostat 30 ml q24 hrs to provide 1684 kcals, 114 gm protein, 1071 ml free water daily.  NUTRITION DIAGNOSIS:  Inadequate oral intake related to inability to eat as evidenced by NPO status.  GOAL:  Patient will meet greater than or equal to 90% of their needs  MONITOR:  Diet advancement, Vent status, Labs, Weight trends, I & O's, Goals of care  REASON FOR ASSESSMENT:  Ventilator    ASSESSMENT:  51 y/o male PMHx Cirrhosis 2/2  Hepatitis B. Presented from home after having episodes of hematemesis s/p esophageal varice banding x7 9/19. During procedure, found to have large amount of fresh blood in stomach and hemostasis not felt achieved s/p emergent TIPS/BRTO 9/19. Extubated 9/20, but  reintubated 9/21 after his code status was reveresed.   Spoke with wife. There is a slight language barrier. She reports that the patient had been in usual state of health up until about a month ago when he was revealed to have advanced cirrhosis. She says he had always ate well at home. They were very active and played sports together all the time.   She says patients UBW is in the 130s  He subjectively appears to have mild-mod muscle wasting, however, the wife says he looks as he always does.   NFPE: mild distension. Again, subjectively appears to have moderate wasting of deltoids, clavicular musculature and temporalis as well as moderate orbital and thoracic wasting.   Patient is currently intubated on ventilator support MV: 9.3 L/min Temp (24hrs), Avg:99.3 F (37.4 C), Min:99.1 F (37.3 C), Max:99.5 F (37.5 C) Propofol: None  Labs: H/H:10.5/31.9, K:3.2, Glu:120-145, Albumin: 2.1 Meds: Lactulose, Octreotide, PPI, Rifaximin, IVF, IV abx, KCl, Propofol   Recent Labs Lab 11/05/16 0330  11/06/16 0345 11/07/16 0209 11/07/16 1406  NA 139 142 146* 147*  K 4.0 3.6 2.9* 3.2*  CL 118* 114* 119* 122*  CO2 19* 24 23 19*  BUN CREATININE 0.78 0.64 0.72 0.72  CALCIUM 6.9* 7.4* 7.4* 7.5*  MG 1.5*  --   --   --   PHOS 3.5  --   --   --   GLUCOSE 143* 130* 122* 138*   Diet Order:  Diet NPO time specified  Skin:  Reviewed, no issues  Last BM:  9/22  Height:  Ht Readings from Last 1 Encounters:  11/04/16  (1.6 m)   Weight:  Wt Readings from Last 1 Encounters:  11/07/16 137 lb 12.6 oz (62.5 kg)   Wt Readings from Last 10 Encounters:  11/07/16 137 lb 12.6 oz (62.5 kg)  10/27/16 130 lb (59 kg)  10/07/16 129 lb 1.6 oz (58.6 kg)  09/25/16 129 lb 6 oz (58.7 kg)   Ideal Body Weight:  52.27 kg  BMI:  Body mass index is 24.41 kg/m.  Estimated Nutritional Needs:  Kcal:  1660 kcals (psu 2003 B) Protein:  106-125g (1.7-2 g/kg bw) Fluid:  Per MD  EDUCATION NEEDS:  No education needs identified at this time  Christophe Louis RD, LDN, CNSC Clinical Nutrition Pager: [phone] 11/07/2016 4:49 PM

## 2016-11-07 NOTE — Progress Notes (Signed)
Chad Ramos 10:34 AM  Subjective: Patient reintubated and case discussed with multiple family members and ICU team and hospital computer chart reviewed and case discussed with my partner Dr. B and his bleeding has significantly decreased but he is encephalopathy but also has gotten some narcotics and benzodiazepine but no other specific complaint and his endoscopy was reviewed  Objective: Vital signs stable afebrile intubated abdomen is slightly protuberant no obvious bowel sounds no obvious tenderness hemoglobin stable platelets and white count okay BUN okay PT stable  Assessment: Cirrhosis with significant variceal bleeding status post banding and TIPS  Plan: Recommend feeding tube with lactulose from above and possibly Xifaxan as well and continue antibiotics and okay to hold repeat endoscopy for now and will check on tomorrow and please call sooner when necessary  [facility] E  Pager [phone] After 5PM or if no answer call [phone]

## 2016-11-07 NOTE — Progress Notes (Signed)
..   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 found to have bleeding esophageal varices s/p banding x 7 s/p TIPS by IR now intubated and transferred to ICU  SIGNIFICANT EVENTS  Transfused a total of 7 units of PRBCs 3 Plts + 2 FFP S/p EGD with variceal banding s/p TIPS  STUDIES:  CT Abdomen with contrast EGD IR TIPS  SUBJECTIVE:  Bleeding, was dying decison made to intubate I spent a lot of time 9/21 with wife - discussions that prognosis was poor and further intervention would be futile and NOT wished by him in past  VITAL SIGNS: Temp:  [97.9 F (36.6 C)-99.5 F (37.5 C)] 99.1 F (37.3 C) (09/22 0731) Pulse Rate:  [79-101] 90 (09/21 1800) Resp:  [14-21] 17 (09/22 0600) BP: (106-149)/(66-90) 111/68 (09/22 0600) SpO2:  [95 %-100 %] 100 % (09/22 0600) Arterial Line BP: (131-137)/(71-76) 137/76 (09/21 0900) FiO2 (%):  [50 %-60 %] 50 % (09/22 0730) Weight:  [62.5 kg (137 lb 12.6 oz)] 62.5 kg (137 lb 12.6 oz) (09/22 0408)  PHYSICAL EXAMINATION: General:  Intubated Neuro: rass -3, on prop 30 HEENT: jvd wnl or low PULM: reduced CV: s1 s2 RRT slight 109 GI: soft, BS low , distended mild, no r/g Extremities: limited edema      Recent Labs Lab 11/05/16 0330 11/06/16 0345 11/07/16 0209  NA 139 142 146*  K 4.0 3.6 2.9*  CL 118* 114* 119*  CO2 19* 24 23  BUN CREATININE 0.78 0.64 0.72  GLUCOSE 143* 130* 122*    Recent Labs Lab 11/05/16 1154 11/06/16 0345 11/06/16 2108 11/07/16 0209  HGB 9.8* 10.2* 11.4* 10.8*  HCT 28.3* 29.1* 33.8* 32.1*  WBC 5.2 5.1  --  5.8  PLT 47* 41*  --  40*   Dg Chest Port 1 View  Result Date: 11/06/2016 CLINICAL DATA:  Intubation EXAM: PORTABLE CHEST 1 VIEW COMPARISON:  11/06/2016 FINDINGS: Interval  placement endotracheal tube with tip measuring 3.4 cm above the carina. Shallow inspiration. Heart size and pulmonary vascularity are normal. There is a developing right pleural effusion with basilar atelectasis or infiltration in the right lung. This could represent developing pneumonia. Left lung is clear. No pneumothorax. Stent in the right upper quadrant likely representing a biliary stent or possibly tips shunt. IMPRESSION: Endotracheal tube tip measures 3.4 cm above the carina. Developing right pleural effusion with atelectasis or infiltration in the right lung base, possibly indicating pneumonia. Electronically Signed   By: Burman Nieves M.D.   On: 11/06/2016 21:43   Dg Chest Port 1 View  Result Date: 11/06/2016 CLINICAL DATA:  Respiratory acidosis. EXAM: PORTABLE CHEST 1 VIEW COMPARISON:  11/04/2016. FINDINGS: Interim extubation. Cardiomegaly. Pulmonary venous congestion Diffuse bilateral pulmonary interstitial prominence, right side greater than left, progressed from prior exam. Findings suggest CHF. Right base pneumonia cannot be excluded . Small right pleural effusion. No pneumothorax IMPRESSION: Findings consistent congestive heart failure with pulmonary interstitial edema and small right pleural effusion. Right base pneumonia cannot be excluded . Electronically Signed   By: Maisie Fus  Register   On: 11/06/2016 06:50    EGD 9/19 Findings : - Patient with actively bleeding esophageal and possible actively bleeding gastric varices. - 800 mL of fresh blood was suctioned from the stomach  but still I was not able to visualize fundus to rule out actively bleeding gastric varices. - 7 bands were placed on very large esophageal varices  CT ABDOMEN/PELVIS IMPRESSION: 1. Stigmata of advanced cirrhosis with severe portal hypertension, most notable for splenomegaly and large gastric and esophageal varices, as discussed above. No aggressive appearing hepatic lesion is noted on today's examination to  suggest hepatocellular carcinoma at this time. 2. Moderate to large volume of ascites. Importantly, there is widespread peritoneal thickening and enhancement which may suggest peritonitis or malignancy. Correlation with paracentesis is suggested for fluid analysis. 3. Subtle areas of wedge-shaped hypoperfusion in both kidneys. This is nonspecific, but could be seen in the setting of pyelonephritis. Correlation with urinalysis is recommended.  ASSESSMENT / PLAN:   Patient Active Problem List   Diagnosis Date Noted  . Acute hepatic encephalopathy   . Encounter for intubation   . Acute blood loss anemia 11/04/2016  . UGIB (upper gastrointestinal bleed) 11/04/2016  . GI bleed 11/04/2016  . Secondary esophageal varices with bleeding (HCC)   . Chronic viral hepatitis B without delta-agent (HCC) 10/27/2016  . 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 Pancytopenia (HCC) 10/07/2016  Hypernatremia Hypokalemia Rt base aspiration concerns    PLAN:  Agree with Dr Carlota Raspberry in full with discussions prior Wife decided for re intubation regardless of his prior wishes and poor outcome Concern for asp / effusion atx rt base, abx for rt base, sputum if able May need peep 8 cpap 5 ps 5, okay for this am , no extubation planned ABg reviewed consider slight drop in MV k supp Consider 1/2 NS hct wnl, prop with wua okay coags noted, repeat vit K Will see GI recs now , for repeat endo? Avoid saline No feeding Lactulose enam as able rifax unable with no upper access Updated wife in full    Ccm time 30 min   Mcarthur Rossetti. Tyson Alias, MD, FACP Pgr: [phone] [facility]

## 2016-11-08 ENCOUNTER — Inpatient Hospital Stay (HOSPITAL_COMMUNITY): Payer: BLUE CROSS/BLUE SHIELD

## 2016-11-08 LAB — BASIC METABOLIC PANEL
Anion gap: 3 — ABNORMAL LOW (ref 5–15)
BUN: 16 mg/dL (ref 6–20)
CALCIUM: 7.3 mg/dL — AB (ref 8.9–10.3)
CO2: 22 mmol/L (ref 22–32)
CREATININE: 0.64 mg/dL (ref 0.61–1.24)
Chloride: 123 mmol/L — ABNORMAL HIGH (ref 101–111)
GFR calc non Af Amer: >60 mL/min (ref >60)
Glucose, Bld: 115 mg/dL — ABNORMAL HIGH (ref 65–99)
Potassium: 3 mmol/L — ABNORMAL LOW (ref 3.5–5.1)
SODIUM: 148 mmol/L — AB (ref 135–145)

## 2016-11-08 LAB — BPAM RBC
BLOOD PRODUCT EXPIRATION DATE: 201809242359
BLOOD PRODUCT EXPIRATION DATE: 201809242359
BLOOD PRODUCT EXPIRATION DATE: 201809242359
BLOOD PRODUCT EXPIRATION DATE: 201809242359
BLOOD PRODUCT EXPIRATION DATE: 201810052359
Blood Product Expiration Date: 201809232359
Blood Product Expiration Date: 201809232359
Blood Product Expiration Date: 201809242359
Blood Product Expiration Date: 201809242359
Blood Product Expiration Date: 201810052359
Blood Product Expiration Date: 201810052359
Blood Product Expiration Date: 201810062359
ISSUE DATE / TIME: 201809190314
ISSUE DATE / TIME: 201809190612
ISSUE DATE / TIME: 201809191447
ISSUE DATE / TIME: 201809191447
ISSUE DATE / TIME: 201809191501
ISSUE DATE / TIME: 201809191501
ISSUE DATE / TIME: 201809191501
ISSUE DATE / TIME: 201809191501
ISSUE DATE / TIME: 201809210710
UNIT TYPE AND RH: 1700
UNIT TYPE AND RH: 7300
UNIT TYPE AND RH: 7300
UNIT TYPE AND RH: 7300
UNIT TYPE AND RH: 7300
UNIT TYPE AND RH: 7300
Unit Type and Rh: 7300
Unit Type and Rh: 7300
Unit Type and Rh: 7300
Unit Type and Rh: 7300
Unit Type and Rh: 7300
Unit Type and Rh: 7300

## 2016-11-08 LAB — TYPE AND SCREEN
ABO/RH(D): B POS
ANTIBODY SCREEN: NEGATIVE
UNIT DIVISION: 0
UNIT DIVISION: 0
UNIT DIVISION: 0
UNIT DIVISION: 0
UNIT DIVISION: 0
UNIT DIVISION: 0
UNIT DIVISION: 0
Unit division: 0
Unit division: 0
Unit division: 0
Unit division: 0
Unit division: 0

## 2016-11-08 LAB — PROTIME-INR
INR: 2.05
PROTHROMBIN TIME: 23 s — AB (ref 11.4–15.2)

## 2016-11-08 LAB — GLUCOSE, CAPILLARY
GLUCOSE-CAPILLARY: 100 mg/dL — AB (ref 65–99)
Glucose-Capillary: 115 mg/dL — ABNORMAL HIGH (ref 65–99)
Glucose-Capillary: 118 mg/dL — ABNORMAL HIGH (ref 65–99)
Glucose-Capillary: 122 mg/dL — ABNORMAL HIGH (ref 65–99)
Glucose-Capillary: 94 mg/dL (ref 65–99)
Glucose-Capillary: 97 mg/dL (ref 65–99)

## 2016-11-08 LAB — HEMOGLOBIN AND HEMATOCRIT, BLOOD
HCT: 29.6 % — ABNORMAL LOW (ref 39.0–52.0)
HEMOGLOBIN: 9.9 g/dL — AB (ref 13.0–17.0)

## 2016-11-08 LAB — APTT: aPTT: 38 seconds — ABNORMAL HIGH (ref 24–36)

## 2016-11-08 LAB — AMMONIA: Ammonia: 54 umol/L — ABNORMAL HIGH (ref 9–35)

## 2016-11-08 MED ORDER — DEXTROSE 5 % IV SOLN
INTRAVENOUS | Status: DC
  Administered 2016-11-08 – 2016-11-10 (×3): via INTRAVENOUS

## 2016-11-08 MED ORDER — POTASSIUM CHLORIDE 10 MEQ/100ML IV SOLN
10.0000 meq | INTRAVENOUS | Status: AC
  Administered 2016-11-08 (×5): 10 meq via INTRAVENOUS
  Filled 2016-11-08 (×5): qty 100

## 2016-11-08 NOTE — Progress Notes (Signed)
Chad Ramos 8:57 AM  Subjective: Patient actually combative today and case discussed with ICU team and no signs of active bleeding and no obvious new problems having some dark stools on lactulose no obvious blood in NG  Objective: Vital signs stable afebrile abdomen is soft nontender obviously hemoglobin okay BUN okay  Assessment: Cirrhosis with variceal bleeding  Plan: Will continue to follow okay to try tube feeds particularly when less combative continue lactulose will check a ammonia level just to get a baseline if he survives this hospital stay outpatient transplant evaluation could be pursued  [facility] E  Pager [phone] After 5PM or if no answer call [phone]

## 2016-11-08 NOTE — Progress Notes (Signed)
Nursing Note: Many times during this nurse's 12 hour shift with this patient  has the patient been agitated, restless, tried pulling at his foley, his rectal tube, and his ET-Tube along with being asynchronous with the ventilator and the wife continues to forbid me to give him any medication for sedation or discomfort.  I have explained to her that this patient was not comfortable and needed medication for comfort.  She said that "the doctor told her that he would get the tube out at 0800 today and that he does not need anything for pain."

## 2016-11-08 NOTE — Progress Notes (Signed)
..   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 found to have bleeding esophageal varices s/p banding x 7 s/p TIPS by IR now intubated and transferred to ICU  SIGNIFICANT EVENTS  Transfused a total of 7 units of PRBCs 3 Plts + 2 FFP S/p EGD with variceal banding s/p TIPS  STUDIES:  CT Abdomen with contrast EGD IR TIPS  SUBJECTIVE:  Increased awakeness agitation Less bleeding, egd postponed not indicated per GI Old blood from below likely No transfusion  VITAL SIGNS: Temp:  [97.6 F (36.4 C)-99.4 F (37.4 C)] 98 F (36.7 C) (09/23 0736) Pulse Rate:  [75-97] 75 (09/23 0724) Resp:  [13-30] 13 (09/23 0700) BP: (89-146)/(63-99) 109/75 (09/23 0700) SpO2:  [97 %-100 %] 100 % (09/23 0700) FiO2 (%):  [40 %] 40 % (09/23 0724) Weight:  [64.6 kg (142 lb 6.7 oz)] 64.6 kg (142 lb 6.7 oz) (09/23 0500)  PHYSICAL EXAMINATION: General: wiggling in room Neuro: agitation, per HEENT: jvd wnl PULM: ronchi CV: s1 s2 rrr no  GI: soft, bs low no r/g, non tender Extremities: min to no edema     Recent Labs Lab 11/07/16 1406 11/07/16 2010 11/08/16 0211  NA 147* 148* 148*  K 3.2* 3.2* 3.0*  CL 122* 125* 123*  CO2 19* 20* 22  BUN CREATININE 0.72 0.66 0.64  GLUCOSE 138* 123* 115*    Recent Labs Lab 11/06/16 0345  11/07/16 0209 11/07/16 0836 11/07/16 1406 11/08/16 0211  HGB 10.2*  < > 10.8* 11.4* 10.5* 9.9*  HCT 29.1*  < > 32.1* 34.4* 31.9* 29.6*  WBC 5.1  --  5.8 5.5  --   --   PLT 41*  --  40* 50*  --   --   < > = values in this interval not displayed. Dg Abd 1 View  Result Date: 11/07/2016 CLINICAL DATA:  Check gastric catheter placement EXAM: ABDOMEN - 1 VIEW COMPARISON:  None. FINDINGS: Scattered large and small bowel gas is noted. Gastric  catheter is noted within the stomach pointing towards the gastric antrum. TIPS shunt is seen. IMPRESSION: Status post TIPS.  Gastric catheter is noted within the stomach. Electronically Signed   By: Alcide Clever M.D.   On: 11/07/2016 11:42   Dg Chest Port 1 View  Result Date: 11/08/2016 CLINICAL DATA:  Respiratory failure EXAM: PORTABLE CHEST 1 VIEW COMPARISON:  11/06/2016 FINDINGS: Endotracheal tube and nasogastric catheter are noted in satisfactory position. Cardiac shadow is stable. Left lung remains clear. Slight improved aeration is noted in the right base. Right-sided pleural effusion and basilar infiltrate remains. IMPRESSION: Improving aeration in the right base although persistent infiltrate and effusion remain. Electronically Signed   By: Alcide Clever M.D.   On: 11/08/2016 07:13   Dg Chest Port 1 View  Result Date: 11/06/2016 CLINICAL DATA:  Intubation EXAM: PORTABLE CHEST 1 VIEW COMPARISON:  11/06/2016 FINDINGS: Interval placement endotracheal tube with tip measuring 3.4 cm above the carina. Shallow inspiration. Heart size and pulmonary vascularity are normal. There is a developing right pleural effusion with basilar atelectasis or infiltration in the right lung. This could represent developing pneumonia. Left lung is clear. No pneumothorax. Stent in the right upper quadrant likely repreLochlann Mastrangelo a biliary stent or possibly tips shunt. IMPRESSION: Endotracheal  tube tip measures 3.4 cm above the carina. Developing right pleural effusion with atelectasis or infiltration in the right lung base, possibly indicating pneumonia. Electronically Signed   By: Burman Nieves M.D.   On: 11/06/2016 21:43    EGD 9/19 Findings : - Patient with actively bleeding esophageal and possible actively bleeding gastric varices. - 800 mL of fresh blood was suctioned from the stomach but still I was not able to visualize fundus to rule out actively bleeding gastric varices. - 7 bands were placed on very large  esophageal varices  CT ABDOMEN/PELVIS IMPRESSION: 1. Stigmata of advanced cirrhosis with severe portal hypertension, most notable for splenomegaly and large gastric and esophageal varices, as discussed above. No aggressive appearing hepatic lesion is noted on today's examination to suggest hepatocellular carcinoma at this time. 2. Moderate to large volume of ascites. Importantly, there is widespread peritoneal thickening and enhancement which may suggest peritonitis or malignancy. Correlation with paracentesis is suggested for fluid analysis. 3. Subtle areas of wedge-shaped hypoperfusion in both kidneys. This is nonspecific, but could be seen in the setting of pyelonephritis. Correlation with urinalysis is recommended.  ASSESSMENT / PLAN:   Patient Active Problem List   Diagnosis Date Noted  . Acute hepatic encephalopathy   . Encounter for intubation   . Acute blood loss anemia 11/04/2016  . UGIB (upper gastrointestinal bleed) 11/04/2016  . GI bleed 11/04/2016  . Secondary esophageal varices with bleeding (HCC)   . Chronic viral hepatitis B without delta-agent (HCC) 10/27/2016  . 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 Pancytopenia (HCC) 10/07/2016  Hypernatremia Hypokalemia Rt base aspiration concerns  Contamination Blood cultures Hypernatremia Hypokalemia  PLAN:  keep same MV , weaning cpap 5 ps5, goal 2 hours, unsure if neurostatus supports extubation pcxr slight haziness remains, comp,ete course ABX 7 days as goal, repeat pcxr in am  Will discuss with wife option of extubation and well defined NO reintubation Even balance to neg , was neg 800, maintain Correct Na, correct K, bmet in am Failed 1/2 NS, add d5w We would only extubate if wife understands NO reintubation, he does not meet mark clinically  Maintain lactulose, continued max rifax Feed when okay by GI Cbc to follow, some slight drop, follow output, GI to hold off re scope Feed? Not  responsive to vit K , dc further vit K , no ffp required now  Main treatment is of enceph He has agitation and required prop, wife refusing Ctx for rt base, coag neg staph 1/2 = contamination  Ccm time 30 min   Mcarthur Rossetti. Tyson Alias, MD, FACP Pgr: [phone] [facility]

## 2016-11-09 ENCOUNTER — Inpatient Hospital Stay (HOSPITAL_COMMUNITY): Payer: BLUE CROSS/BLUE SHIELD

## 2016-11-09 LAB — CBC WITH DIFFERENTIAL/PLATELET
BASOS ABS: 0 10*3/uL (ref 0.0–0.1)
BASOS PCT: 1 %
EOS ABS: 0.2 10*3/uL (ref 0.0–0.7)
EOS PCT: 6 %
HCT: 31.6 % — ABNORMAL LOW (ref 39.0–52.0)
Hemoglobin: 10.6 g/dL — ABNORMAL LOW (ref 13.0–17.0)
LYMPHS PCT: 27 %
Lymphs Abs: 1.1 10*3/uL (ref 0.7–4.0)
MCH: 30.6 pg (ref 26.0–34.0)
MCHC: 33.5 g/dL (ref 30.0–36.0)
MCV: 91.3 fL (ref 78.0–100.0)
MONO ABS: 0.4 10*3/uL (ref 0.1–1.0)
Monocytes Relative: 10 %
Neutro Abs: 2.4 10*3/uL (ref 1.7–7.7)
Neutrophils Relative %: 57 %
PLATELETS: 34 10*3/uL — AB (ref 150–400)
RBC: 3.46 MIL/uL — AB (ref 4.22–5.81)
RDW: 17.3 % — AB (ref 11.5–15.5)
WBC: 4.1 10*3/uL (ref 4.0–10.5)

## 2016-11-09 LAB — POCT I-STAT 7, (LYTES, BLD GAS, ICA,H+H)
Acid-base deficit: 4 mmol/L — ABNORMAL HIGH (ref 0.0–2.0)
Acid-base deficit: 6 mmol/L — ABNORMAL HIGH (ref 0.0–2.0)
BICARBONATE: 20.9 mmol/L (ref 20.0–28.0)
Bicarbonate: 19.4 mmol/L — ABNORMAL LOW (ref 20.0–28.0)
CALCIUM ION: 0.97 mmol/L — AB (ref 1.15–1.40)
Calcium, Ion: 0.87 mmol/L — CL (ref 1.15–1.40)
HEMATOCRIT: 27 % — AB (ref 39.0–52.0)
HEMATOCRIT: 26 % — AB (ref 39.0–52.0)
HEMOGLOBIN: 8.8 g/dL — AB (ref 13.0–17.0)
Hemoglobin: 9.2 g/dL — ABNORMAL LOW (ref 13.0–17.0)
O2 SAT: 100 %
O2 Saturation: 100 %
PCO2 ART: 36.9 mmHg (ref 32.0–48.0)
POTASSIUM: 5.6 mmol/L — AB (ref 3.5–5.1)
POTASSIUM: 4.7 mmol/L (ref 3.5–5.1)
Patient temperature: 36.7
Patient temperature: 36.6
SODIUM: 143 mmol/L (ref 135–145)
Sodium: 142 mmol/L (ref 135–145)
TCO2: 22 mmol/L (ref 22–32)
TCO2: 21 mmol/L — AB (ref 22–32)
pCO2 arterial: 37.1 mmHg (ref 32.0–48.0)
pH, Arterial: 7.36 (ref 7.350–7.450)
pH, Arterial: 7.325 — ABNORMAL LOW (ref 7.350–7.450)
pO2, Arterial: 373 mmHg — ABNORMAL HIGH (ref 83.0–108.0)
pO2, Arterial: 316 mmHg — ABNORMAL HIGH (ref 83.0–108.0)

## 2016-11-09 LAB — BASIC METABOLIC PANEL
Anion gap: 6 (ref 5–15)
BUN: 14 mg/dL (ref 6–20)
CALCIUM: 7.2 mg/dL — AB (ref 8.9–10.3)
CO2: 21 mmol/L — ABNORMAL LOW (ref 22–32)
Chloride: 117 mmol/L — ABNORMAL HIGH (ref 101–111)
Creatinine, Ser: 0.56 mg/dL — ABNORMAL LOW (ref 0.61–1.24)
GFR calc Af Amer: >60 mL/min (ref >60)
Glucose, Bld: 109 mg/dL — ABNORMAL HIGH (ref 65–99)
POTASSIUM: 3.4 mmol/L — AB (ref 3.5–5.1)
SODIUM: 144 mmol/L (ref 135–145)

## 2016-11-09 LAB — CULTURE, RESPIRATORY

## 2016-11-09 LAB — PHOSPHORUS
PHOSPHORUS: 2.7 mg/dL (ref 2.5–4.6)
PHOSPHORUS: 2.4 mg/dL — AB (ref 2.5–4.6)

## 2016-11-09 LAB — GLUCOSE, CAPILLARY
GLUCOSE-CAPILLARY: 102 mg/dL — AB (ref 65–99)
GLUCOSE-CAPILLARY: 113 mg/dL — AB (ref 65–99)
GLUCOSE-CAPILLARY: 124 mg/dL — AB (ref 65–99)
GLUCOSE-CAPILLARY: 138 mg/dL — AB (ref 65–99)
Glucose-Capillary: 102 mg/dL — ABNORMAL HIGH (ref 65–99)
Glucose-Capillary: 92 mg/dL (ref 65–99)
Glucose-Capillary: 131 mg/dL — ABNORMAL HIGH (ref 65–99)

## 2016-11-09 LAB — POCT I-STAT 4, (NA,K, GLUC, HGB,HCT)
Glucose, Bld: 115 mg/dL — ABNORMAL HIGH (ref 65–99)
HCT: 23 % — ABNORMAL LOW (ref 39.0–52.0)
Hemoglobin: 7.8 g/dL — ABNORMAL LOW (ref 13.0–17.0)
Potassium: 5 mmol/L (ref 3.5–5.1)
SODIUM: 144 mmol/L (ref 135–145)

## 2016-11-09 LAB — CULTURE, RESPIRATORY W GRAM STAIN: Culture: NORMAL

## 2016-11-09 LAB — MAGNESIUM
Magnesium: 1.8 mg/dL (ref 1.7–2.4)
Magnesium: 1.8 mg/dL (ref 1.7–2.4)

## 2016-11-09 LAB — TRIGLYCERIDES: TRIGLYCERIDES: 63 mg/dL (ref <150)

## 2016-11-09 MED ORDER — VITAL HIGH PROTEIN PO LIQD
1000.0000 mL | ORAL | Status: DC
  Administered 2016-11-09: 1000 mL

## 2016-11-09 MED ORDER — POTASSIUM CHLORIDE 10 MEQ/100ML IV SOLN
10.0000 meq | INTRAVENOUS | Status: DC
  Administered 2016-11-09 (×2): 10 meq via INTRAVENOUS
  Filled 2016-11-09 (×2): qty 100

## 2016-11-09 MED ORDER — VITAL AF 1.2 CAL PO LIQD
1000.0000 mL | ORAL | Status: DC
  Administered 2016-11-09 – 2016-11-10 (×2): 1000 mL

## 2016-11-09 MED ORDER — POTASSIUM CHLORIDE 20 MEQ/15ML (10%) PO SOLN
40.0000 meq | Freq: Once | ORAL | Status: AC
  Administered 2016-11-09: 40 meq
  Filled 2016-11-09: qty 30

## 2016-11-09 MED ORDER — PRO-STAT SUGAR FREE PO LIQD
30.0000 mL | Freq: Two times a day (BID) | ORAL | Status: DC
  Administered 2016-11-09: 30 mL
  Filled 2016-11-09: qty 30

## 2016-11-09 MED ORDER — VITAMIN K1 10 MG/ML IJ SOLN
10.0000 mg | Freq: Once | INTRAVENOUS | Status: AC
  Administered 2016-11-09: 10 mg via INTRAVENOUS
  Filled 2016-11-09: qty 1

## 2016-11-09 MED ORDER — PRO-STAT SUGAR FREE PO LIQD
30.0000 mL | Freq: Every day | ORAL | Status: DC
Start: 2016-11-10 — End: 2016-11-13
  Administered 2016-11-10 – 2016-11-11 (×2): 30 mL
  Filled 2016-11-09 (×4): qty 30

## 2016-11-09 MED ORDER — FUROSEMIDE 10 MG/ML IJ SOLN
40.0000 mg | Freq: Two times a day (BID) | INTRAMUSCULAR | Status: DC
  Administered 2016-11-09 (×2): 40 mg via INTRAVENOUS
  Filled 2016-11-09 (×3): qty 4

## 2016-11-09 NOTE — Progress Notes (Addendum)
..   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 found to have bleeding esophageal varices s/p banding x 7 s/p TIPS by IR now intubated and transferred to ICU  SIGNIFICANT EVENTS  Transfused a total of 7 units of PRBCs 3 Plts + 2 FFP S/p EGD with variceal banding s/p TIPS  STUDIES:  CT Abdomen with contrast EGD IR TIPS  SUBJECTIVE:  No bleeding Calm this am on vent  VITAL SIGNS: Temp:  [97.7 F (36.5 C)-98.2 F (36.8 C)] 97.7 F (36.5 C) (09/24 0504) Pulse Rate:  [68-76] 68 (09/23 2000) Resp:  [9-24] 16 (09/24 0730) BP: (90-118)/(57-77) 104/74 (09/24 0730) SpO2:  [100 %] 100 % (09/24 0730) FiO2 (%):  [40 %] 40 % (09/24 0327) Weight:  [62.3 kg (137 lb 5.6 oz)] 62.3 kg (137 lb 5.6 oz) (09/24 0600)  PHYSICAL EXAMINATION: General: rass -3 on prop Neuro: prop , rass -3, sedated this am , perrl HEENT: jvd present at 7 cm PULM: CTA, rt base increased coarse CV: s1 s2 RRR  GI: soft, BS wnl, no reb  Extremities: limited edema, no rash      Recent Labs Lab 11/07/16 2010 11/08/16 0211 11/09/16 0457  NA 148* 148* 144  K 3.2* 3.0* 3.4*  CL 125* 123* 117*  CO2 20* 22 21*  BUN CREATININE 0.66 0.64 0.56*  GLUCOSE 123* 115* 109*    Recent Labs Lab 11/07/16 0209 11/07/16 0836 11/07/16 1406 11/08/16 0211 11/09/16 0457  HGB 10.8* 11.4* 10.5* 9.9* 10.6*  HCT 32.1* 34.4* 31.9* 29.6* 31.6*  WBC 5.8 5.5  --   --  4.1  PLT 40* 50*  --   --  34*   Dg Abd 1 View  Result Date: 11/07/2016 CLINICAL DATA:  Check gastric catheter placement EXAM: ABDOMEN - 1 VIEW COMPARISON:  None. FINDINGS: Scattered large and small bowel gas is noted. Gastric catheter is noted within the stomach pointing towards the gastric antrum. TIPS shunt is seen. IMPRESSION:  Status post TIPS.  Gastric catheter is noted within the stomach. Electronically Signed   By: Alcide Clever M.D.   On: 11/07/2016 11:42   Dg Chest Port 1 View  Result Date: 11/09/2016 CLINICAL DATA:  Acute respiratory failure EXAM: PORTABLE CHEST 1 VIEW COMPARISON:  11/08/2016 FINDINGS: Endotracheal tube terminates 2.9 cm above carina.Nasogastric tube extends beyond the inferior aspect of the film. Patient rotated to the left. Midline trachea. Normal heart size for level of inspiration. Persistent right and developing layering left pleural effusion. No pneumothorax. Low lung volumes, accentuating mild pulmonary venous congestion. Similar right and new or increased left base airspace disease. IMPRESSION: Worsened aeration, with developing left pleural effusion and adjacent atelectasis or infection. Similar right-sided aeration with effusion and airspace disease. Mild pulmonary venous congestion. Electronically Signed   By: Jeronimo Greaves M.D.   On: 11/09/2016 06:38   Dg Chest Port 1 View  Result Date: 11/08/2016 CLINICAL DATA:  Respiratory failure EXAM: PORTABLE CHEST 1 VIEW COMPARISON:  11/06/2016 FINDINGS: Endotracheal tube and nasogastric catheter are noted in satisfactory position. Cardiac shadow is stable. Left lung remains clear. Slight improved aeration is noted in the right base. Right-sided pleural effusion and basilar infiltrate remains. IMPRESSION: Improving aeration in the right base although persistent infiltrate and effusion  remain. Electronically Signed   By: Alcide Clever M.D.   On: 11/08/2016 07:13    EGD 9/19 Findings : - Patient with actively bleeding esophageal and possible actively bleeding gastric varices. - 800 mL of fresh blood was suctioned from the stomach but still I was not able to visualize fundus to rule out actively bleeding gastric varices. - 7 bands were placed on very large esophageal varices  CT ABDOMEN/PELVIS IMPRESSION: 1. Stigmata of advanced cirrhosis with severe  portal hypertension, most notable for splenomegaly and large gastric and esophageal varices, as discussed above. No aggressive appearing hepatic lesion is noted on today's examination to suggest hepatocellular carcinoma at this time. 2. Moderate to large volume of ascites. Importantly, there is widespread peritoneal thickening and enhancement which may suggest peritonitis or malignancy. Correlation with paracentesis is suggested for fluid analysis. 3. Subtle areas of wedge-shaped hypoperfusion in both kidneys. This is nonspecific, but could be seen in the setting of pyelonephritis. Correlation with urinalysis is recommended.  ASSESSMENT / PLAN:   Patient Active Problem List   Diagnosis Date Noted  . Acute hepatic encephalopathy   . Encounter for intubation   . Acute blood loss anemia 11/04/2016  . UGIB (upper gastrointestinal bleed) 11/04/2016  . GI bleed 11/04/2016  . Secondary esophageal varices with bleeding (HCC)   . Chronic viral hepatitis B without delta-agent (HCC) 10/27/2016  . 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 Pancytopenia (HCC) 10/07/2016  Hypernatremia Hypokalemia Rt base aspiration concerns  Contamination Blood cultures Hypernatremia Hypokalemia pleural effusion  PLAN:  K supp Start tube feeds pcxr with increasing effusion and edema, need to consider lasix to neg balance, pre extubation with such worsening changes Maintain abx course, add stop date , day 4/7 Weaning cpap 5 ps 5, goal 2 hours BP wnl, if HTn add BB-not needed as of now In am obtain al lytes on lasix Need to re establish DNI status pre extubation if family wish this No bleeding, evident 72 hours longer post band, dc octreotide ppi No plat tx needed, hope also treating dilution will concentrate Upright position Repeat vit K 12m,g for inr noted Prop with full wua planned Continued lactulose and rifax, output is adequate Maintain d5w to improve na  I udpated  wife  Ccm time 30 min   Mcarthur Rossetti. Tyson Alias, MD, FACP Pgr: [phone] [facility]

## 2016-11-09 NOTE — Progress Notes (Signed)
Dr. Gwendolyn Grant note reviewed.  Pt w/out further bleeding; mental status not able to be assessed b/c on ventilator.  Abd is minimally protruberant, has some flank dullness c/w ascites s/p paracentsis at time of TIPS (amount of fluid tapped not evident; apparently not sent for analysis).  Discussed pt's condition w/ pt's wife, who hopes that he can go for liver transplant evaluation once stable.  At present, no suggestions from GI standpoint.  Florencia Reasons, M.D. Pager [phone] If no answer or after 5 PM call [phone]

## 2016-11-09 NOTE — Progress Notes (Signed)
Nutrition Consult / Follow-up  DOCUMENTATION CODES:   Not applicable  INTERVENTION:    Vital AF 1.2 at 55 ml/h (1320 ml per day)  Pro-stat 30 ml once daily  Provides 1684 kcal, 114 gm protein, 1071 ml free water daily  NUTRITION DIAGNOSIS:   Inadequate oral intake related to inability to eat as evidenced by NPO status.  Ongoing  GOAL:   Patient will meet greater than or equal to 90% of their needs  Being addressed with TF  MONITOR:   Vent status, TF tolerance, Labs, I & O's  REASON FOR ASSESSMENT:   Consult Enteral/tube feeding initiation and management  ASSESSMENT:   51 y/o male PMHx Cirrhosis 2/2  Hepatitis B. Presented from home after having episodes of hematemesis s/p esophageal varice banding x7 9/19. During procedure, found to have large amount of fresh blood in stomach and hemostasis not felt achieved s/p emergent TIPS/BRTO 9/19. Extubated 9/20, but  reintubated 9/21 after his code status was reveresed.   Discussed patient in ICU rounds and with RN today. GI following. Plans for liver transplant evaluation once stable. Patient started on TF this morning, Vital High Protein at 40 ml/h with Pro-stat 30 ml BID to provide 1160 kcal, 114 gm protein, 803 ml free water daily. Received MD Consult for TF management. Patient is currently intubated on ventilator support MV: 8 L/min Temp (24hrs), Avg:97.8 F (36.6 C), Min:97.3 F (36.3 C), Max:98.2 F (36.8 C)  Labs reviewed. Potassium 3.4 (L) CBG's: 102-102-92 Medications reviewed and include Octreotide, Lasix, Lactulose.  Diet Order:  Diet NPO time specified  Skin:  Reviewed, no issues  Last BM:  9/22  Height:   Ht Readings from Last 1 Encounters:  11/04/16  (1.6 m)    Weight:   Wt Readings from Last 1 Encounters:  11/09/16 137 lb 5.6 oz (62.3 kg)    Ideal Body Weight:  52.27 kg  BMI:  Body mass index is 24.33 kg/m.  Estimated Nutritional Needs:   Kcal:  1660 kcals (psu 2003  B)  Protein:  106-125g (1.7-2 g/kg bw)  Fluid:  1.6-1.7 L  EDUCATION NEEDS:   No education needs identified at this time  Joaquin Courts, RD, LDN, CNSC Pager [phone] After Hours Pager [phone]

## 2016-11-10 ENCOUNTER — Inpatient Hospital Stay (HOSPITAL_COMMUNITY): Payer: BLUE CROSS/BLUE SHIELD

## 2016-11-10 DIAGNOSIS — K7200 Acute and subacute hepatic failure without coma: Secondary | ICD-10-CM

## 2016-11-10 DIAGNOSIS — J9600 Acute respiratory failure, unspecified whether with hypoxia or hypercapnia: Secondary | ICD-10-CM

## 2016-11-10 LAB — GLUCOSE, CAPILLARY
GLUCOSE-CAPILLARY: 130 mg/dL — AB (ref 65–99)
Glucose-Capillary: 153 mg/dL — ABNORMAL HIGH (ref 65–99)
Glucose-Capillary: 146 mg/dL — ABNORMAL HIGH (ref 65–99)
Glucose-Capillary: 111 mg/dL — ABNORMAL HIGH (ref 65–99)
Glucose-Capillary: 116 mg/dL — ABNORMAL HIGH (ref 65–99)

## 2016-11-10 LAB — PHOSPHORUS
PHOSPHORUS: 3 mg/dL (ref 2.5–4.6)
Phosphorus: 3.3 mg/dL (ref 2.5–4.6)

## 2016-11-10 LAB — CBC
HCT: 35.5 % — ABNORMAL LOW (ref 39.0–52.0)
Hemoglobin: 11.7 g/dL — ABNORMAL LOW (ref 13.0–17.0)
MCH: 29.9 pg (ref 26.0–34.0)
MCHC: 33 g/dL (ref 30.0–36.0)
MCV: 90.8 fL (ref 78.0–100.0)
Platelets: 49 10*3/uL — ABNORMAL LOW (ref 150–400)
RBC: 3.91 MIL/uL — ABNORMAL LOW (ref 4.22–5.81)
RDW: 17.5 % — ABNORMAL HIGH (ref 11.5–15.5)
WBC: 5.3 10*3/uL (ref 4.0–10.5)

## 2016-11-10 LAB — BASIC METABOLIC PANEL
Anion gap: 3 — ABNORMAL LOW (ref 5–15)
BUN: 13 mg/dL (ref 6–20)
CALCIUM: 7.2 mg/dL — AB (ref 8.9–10.3)
CO2: 27 mmol/L (ref 22–32)
CREATININE: 0.68 mg/dL (ref 0.61–1.24)
Chloride: 111 mmol/L (ref 101–111)
GFR calc non Af Amer: >60 mL/min (ref >60)
Glucose, Bld: 150 mg/dL — ABNORMAL HIGH (ref 65–99)
Potassium: 3 mmol/L — ABNORMAL LOW (ref 3.5–5.1)
SODIUM: 141 mmol/L (ref 135–145)

## 2016-11-10 LAB — CULTURE, BLOOD (ROUTINE X 2)
Culture: NO GROWTH
Special Requests: ADEQUATE

## 2016-11-10 LAB — MAGNESIUM
MAGNESIUM: 1.7 mg/dL (ref 1.7–2.4)
Magnesium: 1.7 mg/dL (ref 1.7–2.4)

## 2016-11-10 MED ORDER — POTASSIUM CHLORIDE 20 MEQ/15ML (10%) PO SOLN
40.0000 meq | Freq: Once | ORAL | Status: AC
  Administered 2016-11-10: 40 meq
  Filled 2016-11-10 (×2): qty 30

## 2016-11-10 MED ORDER — FUROSEMIDE 10 MG/ML IJ SOLN
40.0000 mg | Freq: Every day | INTRAMUSCULAR | Status: DC
  Administered 2016-11-10 – 2016-11-13 (×4): 40 mg via INTRAVENOUS
  Filled 2016-11-10 (×3): qty 4

## 2016-11-10 MED ORDER — LACTULOSE 10 GM/15ML PO SOLN
30.0000 g | Freq: Two times a day (BID) | ORAL | Status: DC
  Administered 2016-11-10 – 2016-11-11 (×2): 30 g
  Filled 2016-11-10 (×3): qty 45

## 2016-11-10 MED ORDER — POTASSIUM CHLORIDE 20 MEQ/15ML (10%) PO SOLN
40.0000 meq | Freq: Once | ORAL | Status: AC
  Administered 2016-11-10: 40 meq
  Filled 2016-11-10: qty 30

## 2016-11-10 NOTE — Progress Notes (Signed)
..   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 - 800 mL of fresh blood was suctioned from the stomach but still I was not able to visualize fundus to rule out actively bleeding gastric varices. - 7 bands were placed on very large esophageal varices  CT Abdomen with contrast 8/31 >> ascites, advanced cirrhosis, peritoneal thickening  9/ 20 TIPS  SUBJECTIVE:   Afebrile overnight sedated on propofol gtt     VITAL SIGNS: Temp:  [97.3 F (36.3 C)-99.2 F (37.3 C)] 99.2 F (37.3 C) (09/25 0715) Resp:  [13-24] 24 (09/25 0630) BP: (96-124)/(56-85) 116/68 (09/25 0630) SpO2:  [96 %-100 %] 97 % (09/25 0736) FiO2 (%):  [40 %] 40 % (09/25 0736) Weight:  [128 lb 12 oz (58.4 kg)] 128 lb 12 oz (58.4 kg) (09/25 0331)  PHYSICAL EXAMINATION: General: rass -2 on prop,a cutely ill,  Neuro:  sedated this am , perrl HEENT:oral ETT, no JVD PULM: CTA CV: s1 s2 RRR  GI: soft, BS wnl, non tender Extremities: limited edema, no rash      Recent Labs Lab 11/08/16 0211 11/09/16 0457 11/10/16 0507  NA 148* 144 141  K 3.0* 3.4* 3.0*  CL 123* 117* 111  CO2 22 21* 27  BUN CREATININE 0.64 0.56* 0.68  GLUCOSE 115* 109* 150*    Recent Labs Lab 11/07/16 0209 11/07/16 0836 11/07/16 1406 11/08/16 0211 11/09/16 0457  HGB 10.8* 11.4* 10.5* 9.9* 10.6*  HCT 32.1* 34.4* 31.9* 29.6* 31.6*  WBC 5.8 5.5  --    --  4.1  PLT 40* 50*  --   --  34*   Dg Chest Port 1 View  Result Date: 11/10/2016 CLINICAL DATA:  Respiratory acidosis. EXAM: PORTABLE CHEST 1 VIEW COMPARISON:  11/09/2016. FINDINGS: Endotracheal tube and NG tube in stable position. Heart size stable. Persistent unchanged bilateral pulmonary infiltrates/edema and bilateral pleural effusions. No pneumothorax . IMPRESSION: 1. Lines and tubes in stable position. 2. Persistent bilateral pulmonary infiltrates/ edema and bilateral pleural effusions. No significant change. Electronically Signed   By: Maisie Fus  Register   On: 11/10/2016 06:54   Dg Chest Port 1 View  Result Date: 11/09/2016 CLINICAL DATA:  Acute respiratory failure EXAM: PORTABLE CHEST 1 VIEW COMPARISON:  11/08/2016 FINDINGS: Endotracheal tube terminates 2.9 cm above carina.Nasogastric tube extends beyond the inferior aspect of the film. Patient rotated to the left. Midline trachea. Normal heart size for level of inspiration. Persistent right and developing layering left pleural effusion. No pneumothorax. Low lung volumes, accentuating mild pulmonary venous congestion. Similar right and new or increased left base airspace disease. IMPRESSION: Worsened aeration, with developing left pleural effusion and adjacent atelectasis or infection. Similar right-sided aeration with effusion and airspace disease. Mild pulmonary venous congestion. Electronically Signed   By: Jeronimo Greaves M.D.   On: 11/09/2016 06:38        ASSESSMENT /  PLAN:   Patient Active Problem List   Diagnosis Date Noted  . Acute hepatic encephalopathy   . Encounter for intubation   . Acute blood loss anemia 11/04/2016  . UGIB (upper gastrointestinal bleed) 11/04/2016  . GI bleed 11/04/2016  . Secondary esophageal varices with bleeding (HCC)   . Chronic viral hepatitis B without delta-agent (HCC) 10/27/2016  . 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 Pancytopenia (HCC) 10/07/2016    Acute resp failure    - due to enceph/ ? Aspiration -SBTs with goal extubation when fully awake , tolerating 10/5 pleural effusion - ct lasix  Acute hepatic encephalopathy  - decrease lactulose 30 bid Ct rifaximin -propofol to goal RASS 0  Hypokalemia / Hypernatremia - replete K Check lytes daily -dc D5W   Rt base aspiration  - Contamination Blood cultures 1/2 coag neg staph  -ct ceftx  Prot calorie malnutrition - ct TFs  Alcoholic cirrhosis -  Coagulopathy/ thrombocytopenia - Received vit K 10  X 3 doses  Updated wife  The patient is critically ill with multiple organ systems failure and requires high complexity decision making for assessment and support, frequent evaluation and titration of therapies, application of advanced monitoring technologies and extensive interpretation of multiple databases. Critical Care Time devoted to patient care services described in this note independent of APP time is 32 minutes.    Oretha Milch MD

## 2016-11-10 NOTE — Progress Notes (Signed)
Stable from hepatic standpoint. Ventilator weaning in progress, so it is still too early to assess degree of encephalopathy, although most recent ammonia level was substantially improved from earlier. No evidence of further bleeding, status post variceal banding and subsequent emergent TIPS.  Will continue to follow with you, but no new suggestions from our standpoint.  Florencia Reasons, M.D. Pager [phone] If no answer or after 5 PM call [phone]

## 2016-11-10 NOTE — Progress Notes (Signed)
eLink Physician-Brief Progress Note Patient Name: Beryl Balz DOB: 1965-10-27 MRN: [id number]   Date of Service  11/10/2016  HPI/Events of Note  K+ = 3.0 and Creatinine = 0.68.  eICU Interventions  Will replace K+.     Intervention Category Major Interventions: Electrolyte abnormality - evaluation and management  Sommer,Steven Eugene 11/10/2016, 6:24 AM

## 2016-11-11 ENCOUNTER — Inpatient Hospital Stay (HOSPITAL_COMMUNITY): Payer: BLUE CROSS/BLUE SHIELD

## 2016-11-11 DIAGNOSIS — J9601 Acute respiratory failure with hypoxia: Secondary | ICD-10-CM

## 2016-11-11 LAB — GLUCOSE, CAPILLARY
GLUCOSE-CAPILLARY: 131 mg/dL — AB (ref 65–99)
GLUCOSE-CAPILLARY: 87 mg/dL (ref 65–99)
GLUCOSE-CAPILLARY: 91 mg/dL (ref 65–99)
Glucose-Capillary: 109 mg/dL — ABNORMAL HIGH (ref 65–99)

## 2016-11-11 LAB — CBC
HEMATOCRIT: 31.1 % — AB (ref 39.0–52.0)
HEMOGLOBIN: 10.4 g/dL — AB (ref 13.0–17.0)
MCH: 30.5 pg (ref 26.0–34.0)
MCHC: 33.4 g/dL (ref 30.0–36.0)
MCV: 91.2 fL (ref 78.0–100.0)
Platelets: 38 10*3/uL — ABNORMAL LOW (ref 150–400)
RBC: 3.41 MIL/uL — AB (ref 4.22–5.81)
RDW: 17.5 % — ABNORMAL HIGH (ref 11.5–15.5)
WBC: 4.9 10*3/uL (ref 4.0–10.5)

## 2016-11-11 LAB — BASIC METABOLIC PANEL
ANION GAP: 3 — AB (ref 5–15)
BUN: 14 mg/dL (ref 6–20)
CHLORIDE: 111 mmol/L (ref 101–111)
CO2: 27 mmol/L (ref 22–32)
CREATININE: 0.64 mg/dL (ref 0.61–1.24)
Calcium: 7.3 mg/dL — ABNORMAL LOW (ref 8.9–10.3)
GFR calc non Af Amer: >60 mL/min (ref >60)
Glucose, Bld: 133 mg/dL — ABNORMAL HIGH (ref 65–99)
Potassium: 3.1 mmol/L — ABNORMAL LOW (ref 3.5–5.1)
SODIUM: 141 mmol/L (ref 135–145)

## 2016-11-11 LAB — AMMONIA: AMMONIA: 69 umol/L — AB (ref 9–35)

## 2016-11-11 MED ORDER — LACTULOSE ENEMA
300.0000 mL | Freq: Every day | ORAL | Status: DC
  Administered 2016-11-11: 300 mL via RECTAL
  Filled 2016-11-11 (×2): qty 300

## 2016-11-11 MED ORDER — POTASSIUM CHLORIDE 20 MEQ/15ML (10%) PO SOLN
30.0000 meq | ORAL | Status: AC
  Administered 2016-11-11 (×2): 30 meq
  Filled 2016-11-11 (×2): qty 30

## 2016-11-11 NOTE — Progress Notes (Signed)
[facility] ADULT ICU REPLACEMENT PROTOCOL FOR AM LAB REPLACEMENT ONLY  The patient does apply for the [facility] Adult ICU Electrolyte Replacment Protocol based on the criteria listed below:   1. Is GFR >/= 40 ml/min? Yes.    Patient's GFR today is >60 2. Is urine output >/= 0.5 ml/kg/hr for the last 6 hours? Yes.   Patient's UOP is 1.16 ml/kg/hr 3. Is BUN < 60 mg/dL? Yes.    Patient's BUN today is 14 4. Abnormal electrolyte(s):Potassium 3.1 5. Ordered repletion with: Potassium per protocol 6. If a panic level lab has been reported, has the CCM MD in charge been notified? Yes.   Physician:  Juan Quam, Yousof Alderman P 11/11/2016 4:44 AM

## 2016-11-11 NOTE — Procedures (Signed)
Extubation Procedure Note  Patient Details:   Name: Chad Ramos DOB: [birth year] MRN: [id number]   Airway Documentation:     Evaluation  O2 sats: stable throughout Complications: No apparent complications Patient did tolerate procedure well. Bilateral Breath Sounds: Clear, Diminished   Yes   Pt extubated to 3L [place] per MD order. Pt stable throughout with no complications. Very weak non productive cough post extubation and able to whisper name. Pt encouraged to use [place] to clear secretions. RT will continue to closely monitor pt  Carolan Shiver 11/11/2016, 12:27 PM

## 2016-11-11 NOTE — Progress Notes (Signed)
   pai in rectal area after lactulose placed and tube clamped Very uncomortable  Plan Unclamp rectal tube  Dr. Kalman Shan, M.D., [facility].C.P Pulmonary and Critical Care Medicine Staff Physician Branch [facility] [place] [facility] Pager: [phone], If no answer or between  15:00h - 7:00h: call [phone]  11/11/2016 10:05 PM

## 2016-11-11 NOTE — Progress Notes (Signed)
[facility] Gastroenterology Progress Note  Chad Ramos 51 y.o. [birth year]  CC:  Variceal bleeding/acute hepatic encephalopathy   Subjective:  Patient remains intubated this morning. Wife at bedside. No further bleeding episode.  ROS : Not able to obtain.    Objective: Vital signs in last 24 hours: Vitals:   11/11/16 0743 11/11/16 0800  BP:  (!) 99/59  Pulse:    Resp:  17  Temp: 98 F (36.7 C)   SpO2:  98%    Physical Exam:  General:  Patient currently intubated  Head:  Normocephalic, without obvious abnormality, atraumatic     Lungs:   Coarse breath sounds   Heart:  Regular rate and rhythm, S1, S2 normal  Abdomen:   Soft, non-tender, bowel sounds active all four quadrants,  no masses, no peritoneal signs   Extremities: Extremities normal, atraumatic, no  edema       Lab Results:  Recent Labs  11/10/16 0507 11/10/16 1821 11/11/16 0259  NA 141  --  141  K 3.0*  --  3.1*  CL 111  --  111  CO2 27  --  27  GLUCOSE 150*  --  133*  BUN 13  --  14  CREATININE 0.68  --  0.64  CALCIUM 7.2*  --  7.3*  MG 1.7 1.7  --   PHOS 3.0 3.3  --    No results for input(s): AST, ALT, ALKPHOS, BILITOT, PROT, ALBUMIN in the last 72 hours.  Recent Labs  11/09/16 0457 11/10/16 1121 11/11/16 0259  WBC 4.1 5.3 4.9  NEUTROABS 2.4  --   --   HGB 10.6* 11.7* 10.4*  HCT 31.6* 35.5* 31.1*  MCV 91.3 90.8 91.2  PLT 34* 49* 38*   No results for input(s): LABPROT, INR in the last 72 hours.    Assessment/Plan: - acute hepatic encephalopathy. Probably from recent GI bleed as well as from TIPS placement.  - Variceal bleed. Status post EGD with band ligation and TIPS 11/04/2016  -  Decompensated Cirrhosis from chronic hepatitis B. MELD score 16 on admission  - Chroinc hepatitis B. No previous treatment. He was seen by infectious disease earlier this month. He has been referred to liver care for further management. - Abnormal CT scan 10/16/2016  Showing widespread peritoneal thickening  and enhancement which may suggest peritonitis or malignancy  Recommendations ------------------------ - continue lactulose and rifaximin.continue antibiotics for now - Continue supportive care for now. - Hopefully will be extubated soon  - GI will follow periodically. Recommend outpatient transplant evaluation Once Acute Issues Are Resolved.  Kathi Der MD, FACP 11/11/2016, 8:37 AM  Pager [phone]  If no answer or after 5 PM call [phone]

## 2016-11-11 NOTE — Progress Notes (Signed)
..   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 - 800 mL of fresh blood was suctioned from the stomach but still I was not able to visualize fundus to rule out actively bleeding gastric varices. - 7 bands were placed on very large esophageal varices CT Abdomen with contrast 8/31 >> ascites, advanced cirrhosis, peritoneal thickening   SUBJECTIVE:  No sig change overnight. No further bleeding.     VITAL SIGNS: Temp:  [98 F (36.7 C)-99.4 F (37.4 C)] 98 F (36.7 C) (09/26 0743) Pulse Rate:  [87] 87 (09/26 0827) Resp:  [11-31] 17 (09/26 0827) BP: (92-118)/(54-75) 99/59 (09/26 0827) SpO2:  [95 %-100 %] 96 % (09/26 0827) FiO2 (%):  [40 %] 40 % (09/26 0827) Weight:  [57.3 kg (126 lb 5.2 oz)] 57.3 kg (126 lb 5.2 oz) (09/26 0358)  PHYSICAL EXAMINATION: General: thin male, NAD on vent  Neuro:  RASS -1, propofol off, slow to wake but nods appropriately, follows commands when stimulated  HEENT:oral ETT, no JVD PULM: resps even non labored on PS 5/5, CTA CV: s1 s2 RRR  GI: soft, BS wnl, non tender Extremities: no edema , no rash      Recent Labs Lab 11/09/16 0457 11/10/16 0507 11/11/16 0259  NA 144 141 141  K 3.4* 3.0* 3.1*  CL 117* 111 111  CO2 21* 27 27  BUN CREATININE 0.56* 0.68 0.64  GLUCOSE 109* 150* 133*    Recent Labs Lab 11/09/16 0457 11/10/16 1121  11/11/16 0259  HGB 10.6* 11.7* 10.4*  HCT 31.6* 35.5* 31.1*  WBC 4.1 5.3 4.9  PLT 34* 49* 38*   Dg Chest Port 1 View  Result Date: 11/11/2016 CLINICAL DATA:  Hypoxia EXAM: PORTABLE CHEST 1 VIEW COMPARISON:  November 10, 2016 FINDINGS: Endotracheal tube tip is 4.1 cm above the carina. Nasogastric tube tip and side port are below the diaphragm. No pneumothorax. There are pleural effusions bilaterally, larger on the right than the left. There is no appreciable edema or consolidation. Heart is upper normal in size with pulmonary vascularity within normal limits. No evident adenopathy. No bone lesions. IMPRESSION: Tube positions as described without pneumothorax. Layering effusions bilaterally, larger on the right than on the left. No edema or consolidation evident. Stable cardiac silhouette. Electronically Signed   By: Bretta Bang III M.D.   On: 11/11/2016 07:03   Dg Chest Port 1 View  Result Date: 11/10/2016 CLINICAL DATA:  Respiratory acidosis. EXAM: PORTABLE CHEST 1 VIEW COMPARISON:  11/09/2016. FINDINGS: Endotracheal tube and NG tube in stable position. Heart size stable. Persistent unchanged bilateral pulmonary infiltrates/edema and bilateral pleural effusions. No pneumothorax . IMPRESSION: 1. Lines and tubes in stable position. 2. Persistent bilateral pulmonary infiltrates/ edema and bilateral pleural effusions. No significant change. Electronically Signed   By: Maisie Fus  Register   On: 11/10/2016 06:54  ASSESSMENT / PLAN:  Acute resp failure   - due to hepatic enceph +/- ? Aspiration Pleural effusion R>L  PLAN -  Vent support - 8cc/kg  F/u CXR  F/u ABG Continue PS wean as tol  Waking slowly - may be able to extubate today  Continue gentle diuresis  Continue ceftriaxone    Acute hepatic encephalopathy  PLAN -  Continue lactulose 30 bid Ct rifaximin Propofol off  F/u ammonia   Hypokalemia / Hypernatremia - replete K PLAN -  F/u chem  Replete K    Prot  calorie malnutrition - ct TFs  Alcoholic cirrhosis -  Variceal bleeding - status post variceal banding and subsequent emergent TIPS. PLAN -  Lactulose, rifaximin as above  Thiamine, folate  GI following  NPO for now  F/u ammonia   Coagulopathy/ thrombocytopenia - PLAN -  F/u CBC   Wife updated at bedside 9/26  Dirk Dress, NP 11/11/2016  9:24 AM Pager: [phone] or [phone]  Attending Note:  I have examined patient, reviewed labs, studies and notes. I have discussed the case with Jasper Riling, and I agree with the data and plans as amended above. 51 year old man with cirrhosis, admitted with variceal bleeding, hepatic encephalopathy, acute respiratory failure. He ultimately required TIPS to allow hemostasis. On evaluation today he was following commands, tolerating pressure support ventilation. Meets criteria for extubation and we will work for extubation. Push pulmonary hygiene. Assess for swallowing when okay from a GI standpoint. Continue his lactulose, rifaximin.  Independent critical care time is 35 minutes.   Levy Pupa, MD, PhD 11/11/2016, 2:24 PM [place] [facility] [phone] or if no answer [phone]

## 2016-11-12 LAB — GLUCOSE, CAPILLARY
GLUCOSE-CAPILLARY: 100 mg/dL — AB (ref 65–99)
GLUCOSE-CAPILLARY: 94 mg/dL (ref 65–99)
Glucose-Capillary: 141 mg/dL — ABNORMAL HIGH (ref 65–99)

## 2016-11-12 LAB — CBC
HEMATOCRIT: 32.6 % — AB (ref 39.0–52.0)
HEMOGLOBIN: 10.6 g/dL — AB (ref 13.0–17.0)
MCH: 30 pg (ref 26.0–34.0)
MCHC: 32.5 g/dL (ref 30.0–36.0)
MCV: 92.4 fL (ref 78.0–100.0)
Platelets: 33 10*3/uL — ABNORMAL LOW (ref 150–400)
RBC: 3.53 MIL/uL — AB (ref 4.22–5.81)
RDW: 17.8 % — ABNORMAL HIGH (ref 11.5–15.5)
WBC: 5.6 10*3/uL (ref 4.0–10.5)

## 2016-11-12 LAB — BASIC METABOLIC PANEL
Anion gap: 4 — ABNORMAL LOW (ref 5–15)
BUN: 17 mg/dL (ref 6–20)
CALCIUM: 7.6 mg/dL — AB (ref 8.9–10.3)
CO2: 26 mmol/L (ref 22–32)
Chloride: 110 mmol/L (ref 101–111)
Creatinine, Ser: 0.59 mg/dL — ABNORMAL LOW (ref 0.61–1.24)
GFR calc Af Amer: >60 mL/min (ref >60)
Glucose, Bld: 94 mg/dL (ref 65–99)
POTASSIUM: 3.9 mmol/L (ref 3.5–5.1)
SODIUM: 140 mmol/L (ref 135–145)

## 2016-11-12 LAB — TRIGLYCERIDES: TRIGLYCERIDES: 53 mg/dL (ref <150)

## 2016-11-12 MED ORDER — RIFAXIMIN 550 MG PO TABS
550.0000 mg | ORAL_TABLET | Freq: Two times a day (BID) | ORAL | Status: DC
  Administered 2016-11-12 – 2016-11-13 (×3): 550 mg via ORAL
  Filled 2016-11-12 (×3): qty 1

## 2016-11-12 MED ORDER — PANTOPRAZOLE SODIUM 40 MG PO TBEC
40.0000 mg | DELAYED_RELEASE_TABLET | Freq: Two times a day (BID) | ORAL | Status: DC
  Administered 2016-11-12 – 2016-11-13 (×2): 40 mg via ORAL
  Filled 2016-11-12 (×2): qty 1

## 2016-11-12 MED ORDER — ORAL CARE MOUTH RINSE
15.0000 mL | Freq: Two times a day (BID) | OROMUCOSAL | Status: DC
  Administered 2016-11-12: 15 mL via OROMUCOSAL

## 2016-11-12 MED ORDER — LACTULOSE 10 GM/15ML PO SOLN
30.0000 g | Freq: Two times a day (BID) | ORAL | Status: DC
  Administered 2016-11-12 – 2016-11-13 (×3): 30 g via ORAL
  Filled 2016-11-12 (×3): qty 45

## 2016-11-12 NOTE — Evaluation (Signed)
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 Intubated 9/19-9/21 and reintubated 9/21-9/26. Also with Acute hepatic encephalopathy getting lactulose. PMH includes cirrhosis, pancytopenia.   Clinical Impression  Patient presents with generalized weakness, deconditioning, impaired balance and impaired mobility s/p above. Tolerated standing and short distance ambulation in room with Min A for balance/safety. Pt has support from spouse at home. Pt active PTA, working as Quarry manager and plays badminton and table tennis. May need AD for support at d/c. Will follow acutely to maximize independence and mobility prior to return home.  Supine BP 94/59, sitting BP post activity 102/78.    Follow Up Recommendations Home health PT;Supervision for mobility/OOB    Equipment Recommendations  Other (comment) (TBA)    Recommendations for Other Services OT consult     Precautions / Restrictions Precautions Precautions: Fall Precaution Comments: flexiseal, foley, strap for hernia (brought from home), low BP Restrictions Weight Bearing Restrictions: No      Mobility  Bed Mobility Overal bed mobility: Needs Assistance Bed Mobility: Supine to Sit     Supine to sit: Min assist;HOB elevated     General bed mobility comments: Wife assisting pt get to EOB, asked if he can do it without assist; requires Min Afor trunk support. Mild dizziness. Bp stable.  Transfers Overall transfer level: Needs assistance Equipment used: 1 person hand held assist Transfers: Sit to/from Stand Sit to Stand: Min assist         General transfer comment: Assist to power to standing and for balance.   Ambulation/Gait Ambulation/Gait assistance: Min  assist;+2 safety/equipment Ambulation Distance (Feet): 20 Feet Assistive device: 1 person hand held assist Gait Pattern/deviations: Step-to pattern;Step-through pattern;Decreased stride length;Wide base of support;Decreased step length - right;Decreased step length - left Gait velocity: decreased Gait velocity interpretation: Below normal speed for age/gender General Gait Details: Slow, waddling like gait with handheld assist for support, Min A for balance; VSS throughout.   Stairs            Wheelchair Mobility    Modified Rankin (Stroke Patients Only)       Balance Overall balance assessment: Needs assistance Sitting-balance support: Feet supported;Single extremity supported Sitting balance-Leahy Scale: Fair     Standing balance support: During functional activity;Single extremity supported Standing balance-Leahy Scale: Poor Standing balance comment: Reliant on at least 1 UE support in standing.                              Pertinent Vitals/Pain Pain Assessment: No/denies pain    Home Living Family/patient expects to be discharged to:: Private residence Living Arrangements: Spouse/significant other;Children (Kids are 11 and 17) Available Help at Discharge: Family;Available 24 hours/day Type of Home: House Home Access: Stairs to enter Entrance Stairs-Rails: None Entrance Stairs-Number of Steps: 4-5 long wide steps Home Layout: Two level;Able to live on main level with bedroom/bathroom Home Equipment: None      Prior Function Level of Independence: Independent         Comments: Works in Clinical biochemist; active, loves to play badminton and table tennis. Travels to watch kid play baseball.     Hand Dominance        Extremity/Trunk Assessment   Upper Extremity  Assessment Upper Extremity Assessment: Defer to OT evaluation    Lower Extremity Assessment Lower Extremity Assessment: Generalized weakness       Communication    Communication: No difficulties  Cognition Arousal/Alertness: Awake/alert Behavior During Therapy: WFL for tasks assessed/performed Overall Cognitive Status: Within Functional Limits for tasks assessed                                        General Comments General comments (skin integrity, edema, etc.): Wife present during session. Supine BP 94/59, sitting BP post activity 102/78.    Exercises     Assessment/Plan    PT Assessment Patient needs continued PT services  PT Problem List Decreased strength;Decreased mobility;Decreased balance;Cardiopulmonary status limiting activity;Decreased knowledge of use of DME;Decreased activity tolerance       PT Treatment Interventions Therapeutic activities;Gait training;Therapeutic exercise;Patient/family education;Balance training;Neuromuscular re-education;Functional mobility training;Stair training;DME instruction    PT Goals (Current goals can be found in the Care Plan section)  Acute Rehab PT Goals Patient Stated Goal: to get back to playing sports PT Goal Formulation: With patient Time For Goal Achievement: 11/26/16 Potential to Achieve Goals: Fair    Frequency Min 3X/week   Barriers to discharge Inaccessible home environment stairs to enter home; able to live onf irst level.    Co-evaluation               AM-PAC PT "6 Clicks" Daily Activity  Outcome Measure Difficulty turning over in bed (including adjusting bedclothes, sheets and blankets)?: None Difficulty moving from lying on back to sitting on the side of the bed? : Unable Difficulty sitting down on and standing up from a chair with arms (e.g., wheelchair, bedside commode, etc,.)?: Unable Help needed moving to and from a bed to chair (including a wheelchair)?: A Little Help needed walking in hospital room?: A Little Help needed climbing 3-5 steps with a railing? : A Lot 6 Click Score: 14    End of Session Equipment Utilized During Treatment: Gait  belt Activity Tolerance: Patient tolerated treatment well Patient left: in chair;with call bell/phone within reach;with family/visitor present Nurse Communication: Mobility status PT Visit Diagnosis: Muscle weakness (generalized) (M62.81);Unsteadiness on feet (R26.81);Difficulty in walking, not elsewhere classified (R26.2)    Time: 4098-1191 PT Time Calculation (min) (ACUTE ONLY): 25 min   Charges:   PT Evaluation $PT Eval Moderate Complexity: 1 Mod PT Treatments $Therapeutic Activity: 8-22 mins   PT G Codes:        Mylo Red, PT, DPT [phone]    Blake Divine A Raya Mckinstry 11/12/2016, 10:51 AM

## 2016-11-12 NOTE — Progress Notes (Signed)
..   Name: Chad Ramos MRN: [id number] DOB: [birth year]    ADMISSION DATE:  11/04/2016 CONSULTATION DATE: 11/04/16  REFERRING MD :  Levora Angel MD  CHIEF COMPLAINT:  Hematemesis  BRIEF PATIENT DESCRIPTION: 51 yr old male with chronic Liver cirrhosis secondary to Hep B found to have bleeding esophageal varices s/p banding x 7 s/p TIPS by IR now intubated and transferred to ICU  SIGNIFICANT EVENTS  Transfused a total of 7 units of PRBCs 3 Plts + 2 FFP 9/19 S/p EGD with variceal banding s/p TIPS 9/ 20 TIPS   TUBES/LINES:  ETT 9/21 >> 9/26   STUDIES:  EGD 9/19 - Patient with actively bleeding esophageal and possible actively bleeding gastric varices. - 800 mL of fresh blood was suctioned from the stomach but still I was not able to visualize fundus to rule out actively bleeding gastric varices. - 7 bands were placed on very large esophageal varices CT Abdomen with contrast 8/31 >> ascites, advanced cirrhosis, peritoneal thickening   SUBJECTIVE:  Successfully extubated Much more awake and interacting today.    VITAL SIGNS: Temp:  [97.9 F (36.6 C)-98.6 F (37 C)] 98.5 F (36.9 C) (09/27 0730) Pulse Rate:  [88] 88 (09/26 1112) Resp:  [10-24] 15 (09/27 0800) BP: (87-118)/(57-83) 87/58 (09/27 0800) SpO2:  [95 %-100 %] 99 % (09/27 0800) FiO2 (%):  [40 %] 40 % (09/26 1112) Weight:  [54.1 kg (119 lb 4.3 oz)] 54.1 kg (119 lb 4.3 oz) (09/27 0349)  PHYSICAL EXAMINATION: General: Thin gentleman, ill appearing, awake, comfortable Neuro:  Awake, oriented, responding to questions, moves all extremities HEENT: No oral lesions PULM: Clear bilaterally CV: Regular, no murmur GI: Soft, mildly distended, positive bowel sounds, rectal tube in place Extremities: No edema Skin: No rash    Recent Labs Lab 11/10/16 0507 11/11/16 0259 11/12/16 0619  NA 141 141 140  K 3.0* 3.1* 3.9  CL 111 111 110  CO2 BUN CREATININE 0.68 0.64 0.59*  GLUCOSE 150* 133* 94     Recent Labs Lab 11/10/16 1121 11/11/16 0259 11/12/16 0619  HGB 11.7* 10.4* 10.6*  HCT 35.5* 31.1* 32.6*  WBC 5.3 4.9 5.6  PLT 49* 38* 33*   Dg Chest Port 1 View  Result Date: 11/11/2016 CLINICAL DATA:  Hypoxia EXAM: PORTABLE CHEST 1 VIEW COMPARISON:  November 10, 2016 FINDINGS: Endotracheal tube tip is 4.1 cm above the carina. Nasogastric tube tip and side port are below the diaphragm. No pneumothorax. There are pleural effusions bilaterally, larger on the right than the left. There is no appreciable edema or consolidation. Heart is upper normal in size with pulmonary vascularity within normal limits. No evident adenopathy. No bone lesions. IMPRESSION: Tube positions as described without pneumothorax. Layering effusions bilaterally, larger on the right than on the left. No edema or consolidation evident. Stable cardiac silhouette. Electronically Signed   By: Bretta Bang III M.D.   On: 11/11/2016 07:03        ASSESSMENT / PLAN:  Acute resp failure   - due to hepatic enceph +/- ? Aspiration; extubated 9/26 Pleural effusion R>L  PLAN -  Continue pulmonary hygiene  Continue gentle diuresis  Continue ceftriaxone    Acute hepatic encephalopathy, improved.  PLAN -  Continue lactulose area changed to by mouth formulation and try to avoid enema. Recent TIPS may influence dosing, effectiveness Continue rifaximin Avoid sedating medications Follow up ammonia intermittently  Hypokalemia / Hypernatremia - replete K PLAN -  Follow  BMP, urine output   Prot calorie malnutrition PLAN -  Attempt to initiate by mouth diet on 9/27  Cirrhosis, chronic Hep B, etOH -  Variceal bleeding - status post variceal banding and subsequent emergent TIPS. PLAN -  Continue lactulose, rifaximin as above Continue thiamine, folate Appreciate GIs assistance. Question whether we should reassess for possible suitability for transplant, transfer to a tertiary center for consideration, now that  he has stabilized  Coagulopathy/ thrombocytopenia - PLAN -  Follow CBC. No evidence active bleeding at this time.  Spouse updated at bedside 9/27  Plan transfer to stepdown on 9/27, to [facility] as of 9/28    Levy Pupa, MD, PhD 11/12/2016, 8:52 AM [facility] [phone] or if no answer [phone]

## 2016-11-12 NOTE — Progress Notes (Signed)
[facility] Gastroenterology Progress Note  Chad Ramos 51 y.o. 10-Apr-1965  CC:  Variceal bleeding/acute hepatic encephalopathy   Subjective:  Patient was extubated yesterday. doing better now. Currently sitting in a chair.Denied further bleeding episode. He is currently alert and oriented 3. He was asking question by himself about his liver disease.  ROS : positive for weakness. Positive for fatigue. Negative for chest pain. Negative for difficulty breathing   Objective: Vital signs in last 24 hours: Vitals:   11/12/16 1200 11/12/16 1511  BP: 103/68   Pulse:    Resp: 11   Temp:  98.2 F (36.8 C)  SpO2: 97%     Physical Exam:  General:  Patient is alert and oriented 3. Cooperative.  Head:  Normocephalic, without obvious abnormality, atraumatic     Lungs:   Coarse breath sounds   Heart:  Regular rate and rhythm, S1, S2 normal  Abdomen:   Soft, non-tender, bowel sounds active all four quadrants,  no masses, no peritoneal signs   Extremities: Extremities normal, atraumatic, no  edema       Lab Results:  Recent Labs  11/10/16 0507 11/10/16 1821 11/11/16 0259 11/12/16 0619  NA 141  --  141 140  K 3.0*  --  3.1* 3.9  CL 111  --  111 110  CO2 27  --  27 26  GLUCOSE 150*  --  133* 94  BUN 13  --  14 17  CREATININE 0.68  --  0.64 0.59*  CALCIUM 7.2*  --  7.3* 7.6*  MG 1.7 1.7  --   --   PHOS 3.0 3.3  --   --    No results for input(s): AST, ALT, ALKPHOS, BILITOT, PROT, ALBUMIN in the last 72 hours.  Recent Labs  11/11/16 0259 11/12/16 0619  WBC 4.9 5.6  HGB 10.4* 10.6*  HCT 31.1* 32.6*  MCV 91.2 92.4  PLT 38* 33*   No results for input(s): LABPROT, INR in the last 72 hours.    Assessment/Plan: - acute hepatic encephalopathy. Probably from recent GI bleed as well as from TIPS placement.  resolved - Variceal bleed. Status post EGD with band ligation and TIPS 11/04/2016  -  Decompensated Cirrhosis from chronic hepatitis B. MELD score 16 on admission  -  Chroinc hepatitis B. No previous treatment. He was seen by infectious disease earlier this month. He has been referred to liver care for further management. - Abnormal CT scan 10/16/2016  Showing widespread peritoneal thickening and enhancement which may suggest peritonitis or malignancy  Recommendations ------------------------ - patient is  extubated now. He is  alert and oriented 3.no further bleeding episodes. - Advance diet to soft. - continue lactulose and rifaximin.titrate lactulose to have 2-3 soft bowel movements per day. - change PPI to oral. - Hold off on beta blocker now because of borderline blood pressure. - Continue supportive care for now. - continue antibiotics while in the hospital. - . Recommend outpatient transplant evaluation .transplant process was discussed with wife as well as patient. Multiple questions were answered. - GI will sign off. Follow-up with me in 2 weeks after discharge. Patient needs to be discharged on lactulose, rifaximin and PPI.  Kathi Der MD, FACP 11/12/2016, 3:16 PM  Pager [phone]  If no answer or after 5 PM call [phone]

## 2016-11-13 LAB — CBC
HCT: 31.4 % — ABNORMAL LOW (ref 39.0–52.0)
Hemoglobin: 10.4 g/dL — ABNORMAL LOW (ref 13.0–17.0)
MCH: 30.2 pg (ref 26.0–34.0)
MCHC: 33.1 g/dL (ref 30.0–36.0)
MCV: 91.3 fL (ref 78.0–100.0)
PLATELETS: 36 10*3/uL — AB (ref 150–400)
RBC: 3.44 MIL/uL — AB (ref 4.22–5.81)
RDW: 18.1 % — ABNORMAL HIGH (ref 11.5–15.5)
WBC: 5.3 10*3/uL (ref 4.0–10.5)

## 2016-11-13 LAB — BASIC METABOLIC PANEL
ANION GAP: 3 — AB (ref 5–15)
BUN: 14 mg/dL (ref 6–20)
CALCIUM: 7.4 mg/dL — AB (ref 8.9–10.3)
CO2: 27 mmol/L (ref 22–32)
CREATININE: 0.67 mg/dL (ref 0.61–1.24)
Chloride: 106 mmol/L (ref 101–111)
GFR calc Af Amer: >60 mL/min (ref >60)
GFR calc non Af Amer: >60 mL/min (ref >60)
GLUCOSE: 136 mg/dL — AB (ref 65–99)
Potassium: 3.1 mmol/L — ABNORMAL LOW (ref 3.5–5.1)
Sodium: 136 mmol/L (ref 135–145)

## 2016-11-13 LAB — HEPATIC FUNCTION PANEL
ALBUMIN: 2 g/dL — AB (ref 3.5–5.0)
ALK PHOS: 61 U/L (ref 38–126)
ALT: 29 U/L (ref 17–63)
AST: 43 U/L — ABNORMAL HIGH (ref 15–41)
BILIRUBIN TOTAL: 1.8 mg/dL — AB (ref 0.3–1.2)
Bilirubin, Direct: 0.5 mg/dL (ref 0.1–0.5)
Indirect Bilirubin: 1.3 mg/dL — ABNORMAL HIGH (ref 0.3–0.9)
TOTAL PROTEIN: 4.7 g/dL — AB (ref 6.5–8.1)

## 2016-11-13 LAB — PROTIME-INR
INR: 2.1
Prothrombin Time: 23.4 seconds — ABNORMAL HIGH (ref 11.4–15.2)

## 2016-11-13 LAB — MAGNESIUM: Magnesium: 1.9 mg/dL (ref 1.7–2.4)

## 2016-11-13 MED ORDER — POTASSIUM CHLORIDE CRYS ER 20 MEQ PO TBCR
40.0000 meq | EXTENDED_RELEASE_TABLET | Freq: Once | ORAL | Status: AC
  Administered 2016-11-13: 40 meq via ORAL
  Filled 2016-11-13: qty 2

## 2016-11-13 MED ORDER — POTASSIUM CHLORIDE 20 MEQ PO PACK
40.0000 meq | PACK | Freq: Once | ORAL | Status: DC
  Filled 2016-11-13: qty 2

## 2016-11-13 MED ORDER — ENSURE ENLIVE PO LIQD: 237.0000 mL | Freq: Two times a day (BID) | ORAL | 12 refills | Status: AC

## 2016-11-13 MED ORDER — FUROSEMIDE 20 MG PO TABS: 20.0000 mg | ORAL_TABLET | Freq: Every day | ORAL | 11 refills | Status: AC

## 2016-11-13 MED ORDER — BOOST / RESOURCE BREEZE PO LIQD: 1.0000 | ORAL | 2 refills | Status: AC

## 2016-11-13 MED ORDER — LACTULOSE 10 GM/15ML PO SOLN: 30.0000 g | Freq: Two times a day (BID) | ORAL | 0 refills | Status: AC

## 2016-11-13 MED ORDER — PANTOPRAZOLE SODIUM 20 MG PO TBEC: 20.0000 mg | DELAYED_RELEASE_TABLET | Freq: Every day | ORAL | Status: DC

## 2016-11-13 MED ORDER — SPIRONOLACTONE 25 MG PO TABS
25.0000 mg | ORAL_TABLET | Freq: Every day | ORAL | 11 refills | Status: AC
Start: 2016-11-13 — End: 2017-11-13

## 2016-11-13 MED ORDER — BOOST / RESOURCE BREEZE PO LIQD
1.0000 | ORAL | Status: DC
Start: 2016-11-13 — End: 2016-11-13

## 2016-11-13 MED ORDER — ENSURE ENLIVE PO LIQD
237.0000 mL | Freq: Two times a day (BID) | ORAL | Status: DC
  Administered 2016-11-13: 237 mL via ORAL

## 2016-11-13 MED ORDER — RIFAXIMIN 550 MG PO TABS: 550.0000 mg | ORAL_TABLET | Freq: Two times a day (BID) | ORAL | 1 refills | Status: AC

## 2016-11-13 MED ORDER — ONDANSETRON HCL 4 MG PO TABS: 4.0000 mg | ORAL_TABLET | Freq: Four times a day (QID) | ORAL | 0 refills | Status: AC | PRN

## 2016-11-13 MED ORDER — PANTOPRAZOLE SODIUM 20 MG PO TBEC: 20.0000 mg | DELAYED_RELEASE_TABLET | Freq: Two times a day (BID) | ORAL | 1 refills | Status: AC

## 2016-11-13 NOTE — Progress Notes (Signed)
Rolled patient outside to be picked up by wife. Discharge instructions gone over and no further questions at this time. Tamsen Meek, RN 11/13/2016 2:36 PM

## 2016-11-13 NOTE — Discharge Summary (Signed)
Physician Discharge Summary       Patient ID: Chad Ramos MRN: [id number] DOB/AGE: [birth year] 51 y.o.  Admit date: 11/04/2016 Discharge date: 11/13/2016  Discharge Diagnoses: Hematemesis/ Variceal Bleeding/ Acute hepatic encephalopathy/ Decompensated Cirrhosis 2/2 chronic hepatitis B, MELD score 16 on admission  Detailed Hospital Course:  51 yr old male admitted 11/04/2016  with chronic Liver cirrhosis secondary to untreated Hep B found to have bleeding esophageal varices s/p banding x 7 s/p TIPS by IR on 9/19  now intubated  and transferred to ICU. He was extubated 9/26, has had no further bleeding. His encephalopathy has resolved. CT scan widespread peritoneal thickening and enhancement which may suggestperitonitis or malignancy. Recommendation per GI who was consulted is for outpatient transplant evaluation. Pt has met maximum benefit of inpatient stay and is now ready for discharge  Discharge Plan by active problems  Acute Respiratory Failure 2/2 hepatic encephalopathy >> Resolved Pleural Effusion Plan: Continue pulmonary hygiene OOB and mobilize Continue gentle diuresis with lasix daily  Acute hepatic encephalopathy, improved.  PLAN -  Continue lactulose  For 2-3 soft bowel movements daily Continue rifaximin Avoid sedating medications  Hypokalemia / Hypernatremia - replete K PLAN -  Given 40 MEq of potassium 9/28 Will need follow up with PCP with labs within 1 week of discharge  Prot calorie malnutrition PLAN -  Continue PPI Soft Diet  Boost meal supplements Weigh daily  Cirrhosis, chronic Hep B, etOH -  Variceal bleeding - status post variceal banding and subsequent emergent TIPS. PLAN -  Continue lactulose, rifaximin as above Continue thiamine, folate as inpatient Appreciate GIs assistance. Referral made to [facility] for  possible suitability for transplant, transfer to a tertiary center for consideration, now that he has stabilized  Coagulopathy/ thrombocytopenia  - HGB 10.4 on discharge PLAN -  Follow up with PCP/GI  For HGB check within 1 week of discharge   Significant Hospital tests/ studies  Transfused a total of 7 units of PRBCs 3 Plts + 2 FFP 9/19 S/p EGD with variceal banding s/p TIPS 9/ 20 TIPS STUDIES:  EGD 9/19 - Patient with actively bleeding esophageal and possible actively bleeding gastric varices. - 800 mL of fresh blood was suctioned from the stomach but still I was not able to visualize fundus to rule out actively bleeding gastric varices. - 7 bands were placed on very large esophageal varices CT Abdomen with contrast 8/31 >> ascites, advanced cirrhosis, peritoneal thickening  Consults  GI Dr. Alessandra Bevels   Discharge Exam: BP 105/65   Pulse 88   Temp 98.7 F (37.1 C) (Oral)   Resp 17   Ht _0  (1.6 m)   Wt 118 lb 6.2 oz (53.7 kg)   SpO2 96%   BMI 20.97 kg/m   General: Thin gentleman, ill appearing, awake, comfortable, OOB in chair Neuro:  Awake, oriented, responding to questions, moves all extremities HEENT: No oral lesions, normocephalic, atraumatic PULM: Clear bilaterally, diminished per bases CV:S1, S2,  RRR, no murmur GI: Soft, mildly distended, positive bowel sounds, rectal tube in place Extremities: No edema Skin: No rash or lesions, skin warm dry and intact  Labs at discharge Lab Results  Component Value Date   CREATININE 0.67 11/13/2016   BUN 14 11/13/2016   NA 136 11/13/2016   K 3.1 (L) 11/13/2016   CL 106 11/13/2016   CO2 27 11/13/2016   Lab Results  Component Value Date   WBC 5.3 11/13/2016   HGB 10.4 (L) 11/13/2016   HCT 31.4 (L) 11/13/2016  MCV 91.3 11/13/2016   PLT 36 (L) 11/13/2016   Lab Results  Component Value Date   ALT 29 11/13/2016   AST 43 (H) 11/13/2016   ALKPHOS 61 11/13/2016   BILITOT 1.8 (H) 11/13/2016   Lab Results  Component Value Date   INR 2.10 11/13/2016   INR 2.05 11/08/2016   INR 1.83 11/07/2016    Current radiology studies No results  found.  Disposition:  Final discharge disposition not confirmed Discharge home with spouse  Allergies as of 11/13/2016   No Known Allergies     Medication List    TAKE these medications   feeding supplement (ENSURE ENLIVE) Liqd Take 237 mLs by mouth 2 (two) times daily between meals.   feeding supplement Liqd Take 1 Container by mouth daily.   furosemide 20 MG tablet Commonly known as:  LASIX Take 1 tablet (20 mg total) by mouth daily.   lactulose 10 GM/15ML solution Commonly known as:  CHRONULAC Take 45 mLs (30 g total) by mouth 2 (two) times daily.   ondansetron 4 MG tablet Commonly known as:  ZOFRAN Take 1 tablet (4 mg total) by mouth every 6 (six) hours as needed for nausea.   pantoprazole 20 MG tablet Commonly known as:  PROTONIX Take 1 tablet (20 mg total) by mouth 2 (two) times daily.   rifaximin 550 MG Tabs tablet Commonly known as:  XIFAXAN Take 1 tablet (550 mg total) by mouth 2 (two) times daily.   spironolactone 25 MG tablet Commonly known as:  ALDACTONE Take 1 tablet (25 mg total) by mouth daily.            Discharge Care Instructions        Start     Ordered   11/13/16 0000  ondansetron (ZOFRAN) 4 MG tablet  Every 6 hours PRN     11/13/16 1137   11/13/16 0000  lactulose (CHRONULAC) 10 GM/15ML solution  2 times daily     11/13/16 1137   11/13/16 0000  rifaximin (XIFAXAN) 550 MG TABS tablet  2 times daily     11/13/16 1137   11/13/16 0000  feeding supplement, ENSURE ENLIVE, (ENSURE ENLIVE) LIQD  2 times daily between meals     11/13/16 1137   11/13/16 0000  feeding supplement (BOOST / RESOURCE BREEZE) LIQD  Every 24 hours     11/13/16 1137   11/13/16 0000  pantoprazole (PROTONIX) 20 MG tablet  2 times daily     11/13/16 1137   11/13/16 0000  furosemide (LASIX) 20 MG tablet  Daily     11/13/16 1137   11/13/16 0000  spironolactone (ALDACTONE) 25 MG tablet  Daily     11/13/16 1137   11/05/16 0000  IR Radiologist Eval & Mgmt    Question  Answer Comment  Reason for Exam (SYMPTOM  OR DIAGNOSIS REQUIRED) TIPS 9/20 -- Dr Riley Nearing need 4-5 week TIPS [place] in clinic   Preferred Imaging Location? [facility]      11/05/16 1450     Follow-up Information    Corrie Mckusick, DO Follow up in 1 month(s).   Specialty:  Interventional Radiology Why:  4-6 week follow up appt with Dr Earleen Newport in [facility]; pt will hear from scheduler for time and date; call [phone] if questions or concerns Contact information: [address] [place] [address] [phone]           Discharged Condition: Stable .  Will need follow up with PCP  within 1 week and with GI within 2 weeks  Physician Statement:   The Patient was personally examined, the discharge assessment and plan has been personally reviewed and I agree with ACNP Raysean Graumann's assessment and plan. > 30 minutes of time have been dedicated to discharge assessment, planning and discharge instructions.   Signed: Magdalen Spatz 11/13/2016, 12:09 PM

## 2016-11-13 NOTE — Progress Notes (Signed)
Nutrition Follow-up  DOCUMENTATION CODES:   Severe malnutrition in context of chronic illness  INTERVENTION:    Boost Breeze po once daily, each supplement provides 250 kcal and 9 grams of protein  Ensure Enlive po BID, each supplement provides 350 kcal and 20 grams of protein  NUTRITION DIAGNOSIS:   Malnutrition (severe) related to chronic illness (liver disease) as evidenced by severe depletion of body fat, severe depletion of muscle mass, percent weight loss (9% weight loss within one month).  Ongoing  GOAL:   Patient will meet greater than or equal to 90% of their needs  Unmet  MONITOR:   PO intake, Supplement acceptance  ASSESSMENT:   51 y/o male PMHx Cirrhosis 2/2  Hepatitis B. Presented from home after having episodes of hematemesis s/p esophageal varice banding x7 9/19. During procedure, found to have large amount of fresh blood in stomach and hemostasis not felt achieved s/p emergent TIPS/BRTO 9/19. Extubated 9/20, but  reintubated 9/21 after his code status was reveresed.   Patient was extubated on 9/26. Diet has been advanced to regular. Patient is consuming ~25% of meals. He is trying to choose high protein foods with each meal.   Nutrition-Focused physical exam completed. Findings are severe fat depletion, severe muscle depletion, and unable to assess edema.   He has lost 9% of usual weight in the past month.  Encouraged patient to drink PO supplements such as Ensure Enlive or equivalent at home. Plans for d/c home today.  Diet Order:  Diet regular Room service appropriate? Yes; Fluid consistency: Thin  Skin:  Reviewed, no issues  Last BM:  9/28  Height:   Ht Readings from Last 1 Encounters:  11/04/16  (1.6 m)    Weight:   Wt Readings from Last 1 Encounters:  11/13/16 118 lb 6.2 oz (53.7 kg)    Ideal Body Weight:  52.27 kg  BMI:  Body mass index is 20.97 kg/m.  Estimated Nutritional Needs:   Kcal:  1800-2000  Protein:  80  gm  Fluid:  1.8 L  EDUCATION NEEDS:   No education needs identified at this time  Joaquin Courts, RD, LDN, CNSC Pager [phone] After Hours Pager [phone]

## 2016-11-13 NOTE — Plan of Care (Signed)
Patient is ready to be discharged home, d/w Dr Levora Angel today, cleared for DC, signed off. GI has already sent outpatient referral to [facility] transplant and patient will be called for appt in 1 week. No need to keep in hospital for transfer per GI.  I gave all the recommendations per GI, Dr Levora Angel for discharge to Kandice Robinsons, NP for discharge summary. Discussed with Dr. Delton Coombes, PCCM will take over as patient is ready for discharge.    Thad Ranger M.D. [facility] 11/13/2016, 10:22 AM  Pager: [phone]

## 2016-11-13 NOTE — Progress Notes (Signed)
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 Intubated 9/19-9/21 and reintubated 9/21-9/26. Also with Acute hepatic encephalopathy getting lactulose. PMH includes cirrhosis, pancytopenia.     PT Comments    Patient progressing well towards PT goals. Improved ambulation distance with Min A for balance/safety. Some mild staggering noted but no overt LOB. Pt feels good. Able to stand and donn hernia strap for ~4 mins without support. Encouraged increasing activity and ambulation with RN during the day. Will follow to perform stair training next session as tolerated.    Follow Up Recommendations  Supervision for mobility/OOB;No PT follow up     Equipment Recommendations  None recommended by PT    Recommendations for Other Services       Precautions / Restrictions Precautions Precautions: Fall Precaution Comments: strap for hernia (brought from home) Restrictions Weight Bearing Restrictions: No    Mobility  Bed Mobility Overal bed mobility: Needs Assistance Bed Mobility: Supine to Sit     Supine to sit: Min assist;HOB elevated     General bed mobility comments: Wife assisting pt get to EOB, asked if he can do it without assist; requires Min A for trunk support but prob can do in independently.   Transfers Overall transfer level: Needs assistance Equipment used: 1 person hand held assist Transfers: Sit to/from Stand Sit to Stand: Min guard         General transfer comment: Min guard for safety in standing. Stood from [employer], transferred to chair post ambulation.  Ambulation/Gait Ambulation/Gait assistance: Min assist Ambulation Distance (Feet): 300 Feet Assistive device: 1 person hand held assist Gait  Pattern/deviations: Step-through pattern;Decreased stride length;Staggering right;Staggering left Gait velocity: decreased   General Gait Details: Slow, mildly unsteady gait with handheld assist from wife; some staggering noted but able to recover. VSS.   Stairs            Wheelchair Mobility    Modified Rankin (Stroke Patients Only)       Balance Overall balance assessment: Needs assistance Sitting-balance support: Feet supported;No upper extremity supported Sitting balance-Leahy Scale: Good     Standing balance support: During functional activity Standing balance-Leahy Scale: Fair Standing balance comment: Able to stand without support and assist wife with donning hernia strap for 4 mins.                            Cognition Arousal/Alertness: Awake/alert Behavior During Therapy: WFL for tasks assessed/performed Overall Cognitive Status: Within Functional Limits for tasks assessed                                        Exercises      General Comments General comments (skin integrity, edema, etc.): Wife present during session. VSS throughout.      Pertinent Vitals/Pain Pain Assessment: No/denies pain    Home Living                      Prior Function            PT Goals (current goals can now be found in the care plan section) Progress towards  PT goals: Progressing toward goals    Frequency    Min 3X/week      PT Plan Current plan remains appropriate    Co-evaluation              AM-PAC PT "6 Clicks" Daily Activity  Outcome Measure  Difficulty turning over in bed (including adjusting bedclothes, sheets and blankets)?: None Difficulty moving from lying on back to sitting on the side of the bed? : None Difficulty sitting down on and standing up from a chair with arms (e.g., wheelchair, bedside commode, etc,.)?: Unable Help needed moving to and from a bed to chair (including a wheelchair)?: A  Little Help needed walking in hospital room?: A Little Help needed climbing 3-5 steps with a railing? : A Little 6 Click Score: 18    End of Session Equipment Utilized During Treatment: Gait belt Activity Tolerance: Patient tolerated treatment well Patient left: in chair;with call bell/phone within reach;with family/visitor present Nurse Communication: Mobility status PT Visit Diagnosis: Muscle weakness (generalized) (M62.81);Unsteadiness on feet (R26.81);Difficulty in walking, not elsewhere classified (R26.2)     Time: 1610-9604 PT Time Calculation (min) (ACUTE ONLY): 20 min  Charges:  $Gait Training: 8-22 mins                    G Codes:       Mylo Red, PT, DPT [phone]     Blake Divine A Jaeshawn Silvio 11/13/2016, 10:19 AM

## 2016-11-13 NOTE — Progress Notes (Signed)
OT Cancellation Note  Patient Details Name: Chad Ramos MRN: [id number] DOB: 1965-05-03   Cancelled Treatment:    Reason Eval/Treat Not Completed: After speaking to RN and Pt/wife OT screened, no needs identified, will sign off.  Evern Bio Luanna Weesner 11/13/2016, 11:23 AM  Sherryl Manges OTR/L [phone]

## 2016-11-16 ENCOUNTER — Other Ambulatory Visit: Payer: Self-pay | Admitting: Interventional Radiology

## 2016-11-16 DIAGNOSIS — I8501 Esophageal varices with bleeding: Secondary | ICD-10-CM

## 2016-11-19 DIAGNOSIS — B181 Chronic viral hepatitis B without delta-agent: Secondary | ICD-10-CM | POA: Diagnosis not present

## 2016-11-19 DIAGNOSIS — Z09 Encounter for follow-up examination after completed treatment for conditions other than malignant neoplasm: Secondary | ICD-10-CM | POA: Diagnosis not present

## 2016-11-19 DIAGNOSIS — K9289 Other specified diseases of the digestive system: Secondary | ICD-10-CM | POA: Diagnosis not present

## 2016-11-19 DIAGNOSIS — R1012 Left upper quadrant pain: Secondary | ICD-10-CM | POA: Diagnosis not present

## 2016-11-19 DIAGNOSIS — R0789 Other chest pain: Secondary | ICD-10-CM | POA: Diagnosis not present

## 2016-11-25 ENCOUNTER — Other Ambulatory Visit (HOSPITAL_COMMUNITY): Payer: Self-pay | Admitting: Gastroenterology

## 2016-11-25 ENCOUNTER — Other Ambulatory Visit: Payer: Self-pay | Admitting: Gastroenterology

## 2016-11-25 DIAGNOSIS — B181 Chronic viral hepatitis B without delta-agent: Secondary | ICD-10-CM | POA: Diagnosis not present

## 2016-11-25 DIAGNOSIS — R188 Other ascites: Secondary | ICD-10-CM | POA: Diagnosis not present

## 2016-11-25 DIAGNOSIS — K7290 Hepatic failure, unspecified without coma: Secondary | ICD-10-CM | POA: Diagnosis not present

## 2016-11-25 DIAGNOSIS — K7469 Other cirrhosis of liver: Secondary | ICD-10-CM | POA: Diagnosis not present

## 2016-11-27 ENCOUNTER — Telehealth: Payer: Self-pay | Admitting: *Deleted

## 2016-11-27 NOTE — Telephone Encounter (Signed)
-----   Message from Levert Feinstein, MD sent at 11/27/2016 11:39 AM EDT ----- Rivka Barbara - please call pt wife - records ready to pick up.

## 2016-11-27 NOTE — Telephone Encounter (Signed)
Pt's wife called - informed paperwork/records are ready and will be at the front desk.

## 2016-11-30 ENCOUNTER — Ambulatory Visit (HOSPITAL_COMMUNITY)
Admission: RE | Admit: 2016-11-30 | Discharge: 2016-11-30 | Disposition: A | Discharge status: Home / Self Care | Payer: BLUE CROSS/BLUE SHIELD | Source: Ambulatory Visit | Attending: Gastroenterology | Admitting: Gastroenterology

## 2016-11-30 ENCOUNTER — Encounter (HOSPITAL_COMMUNITY): Payer: Self-pay

## 2016-12-14 DIAGNOSIS — D61818 Other pancytopenia: Secondary | ICD-10-CM | POA: Diagnosis not present

## 2016-12-14 DIAGNOSIS — K7290 Hepatic failure, unspecified without coma: Secondary | ICD-10-CM | POA: Diagnosis not present

## 2016-12-14 DIAGNOSIS — B181 Chronic viral hepatitis B without delta-agent: Secondary | ICD-10-CM | POA: Diagnosis not present

## 2016-12-14 DIAGNOSIS — K7460 Unspecified cirrhosis of liver: Secondary | ICD-10-CM | POA: Diagnosis not present

## 2016-12-14 DIAGNOSIS — Z01818 Encounter for other preprocedural examination: Secondary | ICD-10-CM | POA: Diagnosis not present

## 2016-12-14 DIAGNOSIS — K766 Portal hypertension: Secondary | ICD-10-CM | POA: Diagnosis not present

## 2016-12-14 DIAGNOSIS — Z01810 Encounter for preprocedural cardiovascular examination: Secondary | ICD-10-CM | POA: Diagnosis not present

## 2016-12-14 DIAGNOSIS — I8510 Secondary esophageal varices without bleeding: Secondary | ICD-10-CM | POA: Diagnosis not present

## 2016-12-14 DIAGNOSIS — E43 Unspecified severe protein-calorie malnutrition: Secondary | ICD-10-CM | POA: Diagnosis not present

## 2016-12-15 DIAGNOSIS — I4581 Long QT syndrome: Secondary | ICD-10-CM | POA: Diagnosis not present

## 2016-12-15 DIAGNOSIS — K759 Inflammatory liver disease, unspecified: Secondary | ICD-10-CM | POA: Diagnosis not present

## 2016-12-15 DIAGNOSIS — I864 Gastric varices: Secondary | ICD-10-CM | POA: Diagnosis not present

## 2016-12-15 DIAGNOSIS — I8511 Secondary esophageal varices with bleeding: Secondary | ICD-10-CM | POA: Diagnosis not present

## 2016-12-15 DIAGNOSIS — K766 Portal hypertension: Secondary | ICD-10-CM | POA: Diagnosis not present

## 2016-12-15 DIAGNOSIS — I8500 Esophageal varices without bleeding: Secondary | ICD-10-CM | POA: Diagnosis not present

## 2016-12-15 DIAGNOSIS — R188 Other ascites: Secondary | ICD-10-CM | POA: Diagnosis not present

## 2016-12-15 DIAGNOSIS — R161 Splenomegaly, not elsewhere classified: Secondary | ICD-10-CM | POA: Diagnosis not present

## 2016-12-15 DIAGNOSIS — B181 Chronic viral hepatitis B without delta-agent: Secondary | ICD-10-CM | POA: Diagnosis not present

## 2016-12-15 DIAGNOSIS — Z23 Encounter for immunization: Secondary | ICD-10-CM | POA: Diagnosis not present

## 2016-12-15 DIAGNOSIS — I8390 Asymptomatic varicose veins of unspecified lower extremity: Secondary | ICD-10-CM | POA: Diagnosis not present

## 2016-12-15 DIAGNOSIS — R918 Other nonspecific abnormal finding of lung field: Secondary | ICD-10-CM | POA: Diagnosis not present

## 2016-12-15 DIAGNOSIS — K7460 Unspecified cirrhosis of liver: Secondary | ICD-10-CM | POA: Diagnosis not present

## 2016-12-15 DIAGNOSIS — I517 Cardiomegaly: Secondary | ICD-10-CM | POA: Diagnosis not present

## 2016-12-15 DIAGNOSIS — Z95828 Presence of other vascular implants and grafts: Secondary | ICD-10-CM | POA: Diagnosis not present

## 2016-12-16 ENCOUNTER — Other Ambulatory Visit: Payer: BLUE CROSS/BLUE SHIELD

## 2016-12-16 DIAGNOSIS — Z01818 Encounter for other preprocedural examination: Secondary | ICD-10-CM | POA: Diagnosis not present

## 2016-12-16 DIAGNOSIS — K7460 Unspecified cirrhosis of liver: Secondary | ICD-10-CM | POA: Diagnosis not present

## 2016-12-16 DIAGNOSIS — B181 Chronic viral hepatitis B without delta-agent: Secondary | ICD-10-CM | POA: Diagnosis not present

## 2016-12-16 DIAGNOSIS — Z23 Encounter for immunization: Secondary | ICD-10-CM | POA: Diagnosis not present

## 2016-12-16 DIAGNOSIS — K7689 Other specified diseases of liver: Secondary | ICD-10-CM | POA: Diagnosis not present

## 2016-12-16 DIAGNOSIS — Z95828 Presence of other vascular implants and grafts: Secondary | ICD-10-CM | POA: Diagnosis not present

## 2016-12-16 NOTE — Progress Notes (Signed)
Spoke with DrBrahmbhatt's office to see if patient still having procedure [facility].Unable to reach patient and [facility]. They will return call.

## 2016-12-17 DIAGNOSIS — Z01810 Encounter for preprocedural cardiovascular examination: Secondary | ICD-10-CM | POA: Diagnosis not present

## 2016-12-17 DIAGNOSIS — Z01818 Encounter for other preprocedural examination: Secondary | ICD-10-CM | POA: Diagnosis not present

## 2016-12-17 DIAGNOSIS — B181 Chronic viral hepatitis B without delta-agent: Secondary | ICD-10-CM | POA: Diagnosis not present

## 2016-12-18 ENCOUNTER — Encounter (HOSPITAL_COMMUNITY): Payer: BLUE CROSS/BLUE SHIELD

## 2016-12-18 ENCOUNTER — Encounter (HOSPITAL_COMMUNITY): Admission: RE | Payer: Self-pay | Source: Ambulatory Visit

## 2016-12-18 ENCOUNTER — Ambulatory Visit (HOSPITAL_COMMUNITY)
Admission: RE | Admit: 2016-12-18 | Payer: BLUE CROSS/BLUE SHIELD | Source: Ambulatory Visit | Admitting: Gastroenterology

## 2016-12-18 SURGERY — ESOPHAGOGASTRODUODENOSCOPY (EGD) WITH PROPOFOL
Anesthesia: Monitor Anesthesia Care

## 2016-12-18 SURGERY — EGD (ESOPHAGOGASTRODUODENOSCOPY)
Anesthesia: Monitor Anesthesia Care

## 2016-12-23 DIAGNOSIS — E43 Unspecified severe protein-calorie malnutrition: Secondary | ICD-10-CM | POA: Diagnosis not present

## 2016-12-23 DIAGNOSIS — D61818 Other pancytopenia: Secondary | ICD-10-CM | POA: Diagnosis not present

## 2016-12-23 DIAGNOSIS — B181 Chronic viral hepatitis B without delta-agent: Secondary | ICD-10-CM | POA: Diagnosis not present

## 2016-12-23 DIAGNOSIS — Z01818 Encounter for other preprocedural examination: Secondary | ICD-10-CM | POA: Diagnosis not present

## 2016-12-23 DIAGNOSIS — K7460 Unspecified cirrhosis of liver: Secondary | ICD-10-CM | POA: Diagnosis not present

## 2016-12-28 DIAGNOSIS — Z1211 Encounter for screening for malignant neoplasm of colon: Secondary | ICD-10-CM | POA: Diagnosis not present

## 2016-12-30 DIAGNOSIS — K7469 Other cirrhosis of liver: Secondary | ICD-10-CM | POA: Diagnosis not present

## 2016-12-30 DIAGNOSIS — B181 Chronic viral hepatitis B without delta-agent: Secondary | ICD-10-CM | POA: Diagnosis not present

## 2017-01-05 ENCOUNTER — Ambulatory Visit
Admission: RE | Admit: 2017-01-05 | Discharge: 2017-01-05 | Disposition: A | Discharge status: Home / Self Care | Payer: BLUE CROSS/BLUE SHIELD | Source: Ambulatory Visit | Attending: Interventional Radiology | Admitting: Interventional Radiology

## 2017-01-05 ENCOUNTER — Ambulatory Visit
Admission: RE | Admit: 2017-01-05 | Discharge: 2017-01-05 | Disposition: A | Discharge status: Home / Self Care | Payer: BLUE CROSS/BLUE SHIELD | Source: Ambulatory Visit | Attending: Radiology | Admitting: Radiology

## 2017-01-05 DIAGNOSIS — Z9689 Presence of other specified functional implants: Secondary | ICD-10-CM | POA: Diagnosis not present

## 2017-01-05 DIAGNOSIS — I8501 Esophageal varices with bleeding: Secondary | ICD-10-CM | POA: Diagnosis not present

## 2017-01-05 DIAGNOSIS — Z9889 Other specified postprocedural states: Secondary | ICD-10-CM | POA: Diagnosis not present

## 2017-01-05 HISTORY — PX: IR RADIOLOGIST EVAL & MGMT: IMG5224

## 2017-01-05 NOTE — Progress Notes (Signed)
Chief Complaint:  I had a TIPS  Referring Physician(s): GI - Dr. Levora Angel [facility] - emergent EGD 11/04/2016) Dr. Mervin Hack - PCP Deboraha Sprang)   History of Present Illness: Chad Ramos is a 51 y.o. male presenting as a scheduled follow up appointment to [facility], SP emergent TIPS performed 11/04/2016 for acute upper GI hemorrhage.  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 This was his first-ever episode of GI hemorrhage.   He is here today with his wife for the post-op appointment.    He was admitted 11/04/2016 through the ED at [facility] for life-threatening acute upper GI hemorrhage, with EGD and gastric banding performed 9/19 by Dr. Levora Angel.  Active bleeding was observed, 7 bands placed.  Consult was performed with VIR, and TIPS was performed same day for ongoing hemorrhage.   He was admitted to ICU.  Fluid resuscitation was performed.  He needed reintubation on 9/21 secondary to suspected encephalopathy.  Extubated again on 9/26, and was discharged home 9/28.    Patient has had an appointment with [facility] hepatology and the transplant team.  They have seen Dr. Brooke Dare (Hepatology) and Dr. Joanne Gavel. Rennis Harding (Transplant Sx).  He is awaiting some screening tests, and has not yet been listed.  They tell me follow up appointments are scheduled for January 2019.     He has not yet had follow up EGD with Dr. Levora Angel - although this was scheduled, they cancelled the appointment.    He tells me that overall he is feeling improved.  He denies any new hematemesis/melana.  His stools are "dry", and he has average of 2BM / day.  He tells me he has stopped taking his lactulose as of about 1 week ago as it makes him feel "gassy".  He has stopped taking lasix on advice from his physicians, but is still taking spironolactone.  He will discuss this medicine with his PCP.    He denies any mental status changes, and his wife says he has not had any status changes.  His appetite  is returning, and he tells me he eats 3 meals per day.  He endorses occasional nausea, no vomiting.    Today he had an [place] of liver performed, demonstrating patient TIPS shunt.  No ascites or large varices.       Past Medical History:  Diagnosis Date  . Cirrhosis (HCC)   . Pancytopenia (HCC) 10/07/2016    Past Surgical History:  Procedure Laterality Date  . ESOPHAGOGASTRODUODENOSCOPY (EGD) WITH PROPOFOL N/A 11/04/2016   Performed by Kathi Der, MD at [facility] ENDOSCOPY  . IR PARACENTESIS  11/04/2016  . IR TIPS  11/04/2016  . RADIOLOGY WITH ANESTHESIA N/A 11/04/2016   Performed by Irish Lack, MD at [facility] OR    Allergies: Patient has no known allergies.  Medications: Prior to Admission medications   Medication Sig Start Date End Date Taking? Authorizing Provider  entecavir (BARACLUDE) 1 MG tablet Take 1 mg by mouth daily.    [provider]  feeding supplement (BOOST / RESOURCE BREEZE) LIQD Take 1 Container by mouth daily. Patient not taking: Reported on 12/10/2016 11/13/16   Bevelyn Ngo, NP  feeding supplement, ENSURE ENLIVE, (ENSURE ENLIVE) LIQD Take 237 mLs by mouth 2 (two) times daily between meals. Patient taking differently: Take 118.5 mLs by mouth 2 (two) times daily as needed (for nutritional support when tolerated.).  11/13/16   Bevelyn Ngo, NP  furosemide (LASIX) 20 MG tablet Take 1 tablet (20 mg  total) by mouth daily. 11/13/16 11/13/17  Bevelyn NgoGroce, Sarah F, NP  lactulose (CHRONULAC) 10 GM/15ML solution Take 45 mLs (30 g total) by mouth 2 (two) times daily. Patient taking differently: Take 13 g by mouth 2 (two) times daily.  11/13/16   Bevelyn NgoGroce, Sarah F, NP  ondansetron (ZOFRAN) 4 MG tablet Take 1 tablet (4 mg total) by mouth every 6 (six) hours as needed for nausea. Patient not taking: Reported on 12/10/2016 11/13/16   Bevelyn NgoGroce, Sarah F, NP  pantoprazole (PROTONIX) 20 MG tablet Take 1 tablet (20 mg total) by mouth 2 (two) times daily. 11/13/16   Bevelyn NgoGroce, Sarah F, NP  rifaximin  (XIFAXAN) 550 MG TABS tablet Take 1 tablet (550 mg total) by mouth 2 (two) times daily. 11/13/16   Bevelyn NgoGroce, Sarah F, NP  spironolactone (ALDACTONE) 25 MG tablet Take 1 tablet (25 mg total) by mouth daily. 11/13/16 11/13/17  Bevelyn NgoGroce, Sarah F, NP     No family history on file.  Social History   Socioeconomic History  . Marital status: Married    Spouse name: Not on file  . Number of children: Not on file  . Years of education: Not on file  . Highest education level: Not on file  Social Needs  . Financial resource strain: Not on file  . Food insecurity - worry: Not on file  . Food insecurity - inability: Not on file  . Transportation needs - medical: Not on file  . Transportation needs - non-medical: Not on file  Occupational History  . Not on file  Tobacco Use  . Smoking status: Never Smoker  . Smokeless tobacco: Never Used  Substance and Sexual Activity  . Alcohol use: No  . Drug use: No  . Sexual activity: Not on file  Other Topics Concern  . Not on file  Social History Narrative  . Not on file       Review of Systems: A 12 point ROS discussed and pertinent positives are indicated in the HPI above.  All other systems are negative.  Review of Systems  Vital Signs: BP 98/65   Pulse 69   Temp 98.1 F (36.7 C) (Oral)   Resp 14   Ht 5\' 3"  (1.6 m)   Wt 123 lb (55.8 kg)   SpO2 99%   BMI 21.79 kg/m   Physical Exam General: 51 yo male appearing than stated age.  Well-developed, well-nourished.  No distress. HEENT: Atraumatic, normocephalic.  Conjugate gaze, extra-ocular motor intact. No scleral icterus or scleral injection. No lesions on external ears, nose, lips, or gums.  Oral mucosa moist, pink.  Neck: Symmetric with no goiter enlargement.  Chest/Lungs:  Symmetric chest with inspiration/expiration.  No labored breathing.   Heart:  No JVD appreciated.  Abdomen:  Soft, NT/ND,   Genito-urinary: Deferred Neurologic: Alert & Oriented to person, place, and time.   Normal  affect and insight.  Appropriate questions.  Moving all 4 extremities with gross sensory intact.  Extremities:  no wound.   No edema of the lower extremity. Well healed puncture sites at the right IJ and the RUQ.    Labs:  CBC: Recent Labs    11/10/16 1121 11/11/16 0259 11/12/16 0619 11/13/16 0348  WBC 5.3 4.9 5.6 5.3  HGB 11.7* 10.4* 10.6* 10.4*  HCT 35.5* 31.1* 32.6* 31.4*  PLT 49* 38* 33* 36*    COAGS: Recent Labs    11/04/16 1618 11/04/16 2108 11/05/16 1119 11/07/16 0209 11/08/16 0211 11/13/16 0348  INR 2.07 1.69 2.03  1.83 2.05 2.10  APTT 39* 38* 40*  --  38*  --     BMP: Recent Labs    11/10/16 0507 11/11/16 0259 11/12/16 0619 11/13/16 0348  NA 141 141 140 136  K 3.0* 3.1* 3.9 3.1*  CL 111 111 110 106  CO2 27 27 26 27   GLUCOSE 150* 133* 94 136*  BUN 13 14 17 14   CALCIUM 7.2* 7.3* 7.6* 7.4*  CREATININE 0.68 0.64 0.59* 0.67  GFRNONAA >60 >60 >60 >60  GFRAA >60 >60 >60 >60    LIVER FUNCTION TESTS: Recent Labs    11/04/16 2028 11/06/16 0345 11/07/16 0209 11/13/16 0348  BILITOT 5.1* 2.5* 2.1* 1.8*  AST 50* 81* 101* 43*  ALT 31 51 65* 29  ALKPHOS 42 50 51 61  PROT 4.3* 4.4* 4.3* 4.7*  ALBUMIN 2.5* 2.3* 2.1* 2.0*    TUMOR MARKERS: No results for input(s): AFPTM, CEA, CA199, CHROMGRNA in the last 8760 hours.  Assessment and Plan:  Mr Modesta MessingCao is a 51 year old male SP TIPS on 9/19 for life-threatening GI hemorrhage secondary to esophageal varices, refractory to multiple bands.    He has done well after his 9 day admission, with no recurrent bleeding episodes.    His liver duplex exam today shows us a patent TIPS shunt with no ascites or visible varices.    Although he has stopped taking his lactulose secondary to GI side-effects, he has not had any evidence of recurrent encephalopathy.  I have advised him that he should continue taking the lactulose as prescribed in order to avoid any problems, and he voices his understanding.    He has initiated  a [facility], with follow up scheduled at this time.  He has not had repeat EGD with his [facility] physicians, as he cancelled his appointment recently scheduled.    I have encouraged him to follow up with both his PCP and GI physician at [facility], as well as observe his follow up appointments at [facility].    We will schedule a repeat liver duplex in 6 months and again at 12 months (September 2019 - if no transplant has occurred).  After that time 12 month surveillance is reasonable until a transplant, although this schedule may change in accord with any HCC surveillance imaging that may be scheduled.    Recs: - We will schedule a 6 month liver duplex.  - I have advised him to follow up with his PCP regarding his med rec after his discharge.  - I have advised him to continue to take lactulose as prescribed.  - I have advised him to observe all of his future follow up with [facility].  Electronically Signed: Gilmer MorWAGNER, Ozetta Flatley 01/05/2017, 8:49 AM   I spent a total of    40 Minutes in face to face in clinical consultation, greater than 50% of which was counseling/coordinating care for cirrhosis, GI bleeding, SP TIPS.

## 2017-01-15 ENCOUNTER — Other Ambulatory Visit: Payer: Self-pay | Admitting: Acute Care

## 2017-01-18 DIAGNOSIS — Z01818 Encounter for other preprocedural examination: Secondary | ICD-10-CM | POA: Diagnosis not present

## 2017-01-18 DIAGNOSIS — B181 Chronic viral hepatitis B without delta-agent: Secondary | ICD-10-CM | POA: Diagnosis not present

## 2017-01-18 DIAGNOSIS — K7460 Unspecified cirrhosis of liver: Secondary | ICD-10-CM | POA: Diagnosis not present

## 2017-01-26 ENCOUNTER — Encounter: Payer: Self-pay | Admitting: Interventional Radiology

## 2017-02-03 DIAGNOSIS — K7469 Other cirrhosis of liver: Secondary | ICD-10-CM | POA: Diagnosis not present

## 2017-02-03 DIAGNOSIS — B181 Chronic viral hepatitis B without delta-agent: Secondary | ICD-10-CM | POA: Diagnosis not present

## 2017-02-13 ENCOUNTER — Emergency Department (HOSPITAL_COMMUNITY): Payer: BLUE CROSS/BLUE SHIELD

## 2017-02-13 ENCOUNTER — Encounter (HOSPITAL_COMMUNITY): Payer: Self-pay

## 2017-02-13 ENCOUNTER — Emergency Department (HOSPITAL_COMMUNITY)
Admission: EM | Admit: 2017-02-13 | Discharge: 2017-02-13 | Disposition: A | Discharge status: Home / Self Care | Payer: BLUE CROSS/BLUE SHIELD | Attending: Physician Assistant | Admitting: Physician Assistant

## 2017-02-13 DIAGNOSIS — R0789 Other chest pain: Secondary | ICD-10-CM | POA: Insufficient documentation

## 2017-02-13 DIAGNOSIS — J189 Pneumonia, unspecified organism: Secondary | ICD-10-CM | POA: Insufficient documentation

## 2017-02-13 DIAGNOSIS — R5383 Other fatigue: Secondary | ICD-10-CM | POA: Insufficient documentation

## 2017-02-13 DIAGNOSIS — R05 Cough: Secondary | ICD-10-CM | POA: Diagnosis not present

## 2017-02-13 LAB — COMPREHENSIVE METABOLIC PANEL
ALT: 23 U/L (ref 17–63)
AST: 34 U/L (ref 15–41)
Albumin: 2 g/dL — ABNORMAL LOW (ref 3.5–5.0)
Alkaline Phosphatase: 115 U/L (ref 38–126)
Anion gap: 8 (ref 5–15)
BILIRUBIN TOTAL: 3.4 mg/dL — AB (ref 0.3–1.2)
BUN: 11 mg/dL (ref 6–20)
CO2: 20 mmol/L — ABNORMAL LOW (ref 22–32)
CREATININE: 0.81 mg/dL (ref 0.61–1.24)
Calcium: 7.9 mg/dL — ABNORMAL LOW (ref 8.9–10.3)
Chloride: 94 mmol/L — ABNORMAL LOW (ref 101–111)
GFR calc Af Amer: >60 mL/min (ref >60)
Glucose, Bld: 450 mg/dL — ABNORMAL HIGH (ref 65–99)
POTASSIUM: 3.9 mmol/L (ref 3.5–5.1)
Sodium: 122 mmol/L — ABNORMAL LOW (ref 135–145)
TOTAL PROTEIN: 6.5 g/dL (ref 6.5–8.1)

## 2017-02-13 LAB — CBC WITH DIFFERENTIAL/PLATELET
BASOS ABS: 0 10*3/uL (ref 0.0–0.1)
BASOS PCT: 0 %
EOS ABS: 0.2 10*3/uL (ref 0.0–0.7)
EOS PCT: 2 %
HCT: 40.6 % (ref 39.0–52.0)
Hemoglobin: 13.8 g/dL (ref 13.0–17.0)
Lymphocytes Relative: 15 %
Lymphs Abs: 1.4 10*3/uL (ref 0.7–4.0)
MCH: 29.2 pg (ref 26.0–34.0)
MCHC: 34 g/dL (ref 30.0–36.0)
MCV: 85.8 fL (ref 78.0–100.0)
Monocytes Absolute: 1 10*3/uL (ref 0.1–1.0)
Monocytes Relative: 11 %
Neutro Abs: 6.7 10*3/uL (ref 1.7–7.7)
Neutrophils Relative %: 72 %
PLATELETS: 81 10*3/uL — AB (ref 150–400)
RBC: 4.73 MIL/uL (ref 4.22–5.81)
RDW: 13.4 % (ref 11.5–15.5)
WBC: 9.4 10*3/uL (ref 4.0–10.5)

## 2017-02-13 LAB — I-STAT CG4 LACTIC ACID, ED: Lactic Acid, Venous: 1.95 mmol/L — ABNORMAL HIGH (ref 0.5–1.9)

## 2017-02-13 MED ORDER — GUAIFENESIN-CODEINE 100-10 MG/5ML PO SOLN: 5.0000 mL | Freq: Four times a day (QID) | ORAL | 0 refills | Status: AC | PRN

## 2017-02-13 MED ORDER — LEVOFLOXACIN 750 MG PO TABS: 750.0000 mg | ORAL_TABLET | Freq: Every day | ORAL | 0 refills | Status: AC

## 2017-02-13 MED ORDER — SODIUM CHLORIDE 0.9 % IV BOLUS (SEPSIS): 1000.0000 mL | Freq: Once | INTRAVENOUS | Status: DC

## 2017-02-13 MED ORDER — LEVOFLOXACIN 750 MG PO TABS
750.0000 mg | ORAL_TABLET | Freq: Once | ORAL | Status: AC
  Administered 2017-02-13: 750 mg via ORAL
  Filled 2017-02-13: qty 1

## 2017-02-13 NOTE — Discharge Instructions (Signed)
You were seen with community-acquired pneumonia.  Please use antibiotics every day.  Please use the cough syrup to help with your cough.  Please return with any concerns.

## 2017-02-13 NOTE — ED Notes (Signed)
Pt still in lobby, room occupied. RN or Tech will come get pt when room becomes available.

## 2017-02-13 NOTE — ED Triage Notes (Signed)
Pt presents for evaluation of cough with L side pain. States has been coughing since d/c from ICU in September but states cough has worsened in last week with productive yellow mucus. Wife states was running fever last night.

## 2017-02-13 NOTE — ED Provider Notes (Signed)
[facility] EMERGENCY DEPARTMENT Provider Note   CSN: [id number] Arrival date & time: 02/13/17  1021     History   Chief Complaint Chief Complaint  Patient presents with  . Cough  . Fever    HPI Chad Ramos is a 51 y.o. male.  HPI   Patient is a 51 year old male with past medical history significant for cirrhosis, about to be added to the transplant list at [facility] for liver treatment.  He is presenting today with cough.  Patient had cough, left-sided chest pain with coughing.  Had low-grade fevers.  No recent hospitalization.  No shortness of breath.  Past Medical History:  Diagnosis Date  . Cirrhosis (HCC)   . Pancytopenia (HCC) 10/07/2016    Patient Active Problem List   Diagnosis Date Noted  . Acute hepatic encephalopathy   . Encounter for intubation   . Acute blood loss anemia 11/04/2016  . UGIB (upper gastrointestinal bleed) 11/04/2016  . GI bleed 11/04/2016  . Secondary esophageal varices with bleeding (HCC)   . Chronic viral hepatitis B without delta-agent (HCC) 10/27/2016  . 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 Pancytopenia (HCC) 10/07/2016    Past Surgical History:  Procedure Laterality Date  . ESOPHAGOGASTRODUODENOSCOPY (EGD) WITH PROPOFOL N/A 11/04/2016   Procedure: ESOPHAGOGASTRODUODENOSCOPY (EGD) WITH PROPOFOL;  Surgeon: Kathi Der, MD;  Location: [facility] ENDOSCOPY;  Service: Gastroenterology;  Laterality: N/A;  . IR PARACENTESIS  11/04/2016  . IR RADIOLOGIST EVAL & MGMT  01/05/2017  . IR TIPS  11/04/2016  . RADIOLOGY WITH ANESTHESIA N/A 11/04/2016   Procedure: RADIOLOGY WITH ANESTHESIA;  Surgeon: Irish Lack, MD;  Location: [facility] OR;  Service: Radiology;  Laterality: N/A;       Home Medications    Prior to Admission medications   Medication Sig Start Date End Date Taking? Authorizing Provider  entecavir (BARACLUDE) 1 MG tablet Take 1 mg by mouth daily.    [provider]  feeding  supplement (BOOST / RESOURCE BREEZE) LIQD Take 1 Container by mouth daily. Patient not taking: Reported on 01/05/2017 11/13/16   Bevelyn Ngo, NP  feeding supplement, ENSURE ENLIVE, (ENSURE ENLIVE) LIQD Take 237 mLs by mouth 2 (two) times daily between meals. Patient taking differently: Take 118.5 mLs by mouth 2 (two) times daily as needed (for nutritional support when tolerated.).  11/13/16   Bevelyn Ngo, NP  furosemide (LASIX) 20 MG tablet Take 1 tablet (20 mg total) by mouth daily. Patient not taking: Reported on 01/05/2017 11/13/16 11/13/17  Bevelyn Ngo, NP  lactulose (CHRONULAC) 10 GM/15ML solution Take 45 mLs (30 g total) by mouth 2 (two) times daily. Patient not taking: Reported on 01/05/2017 11/13/16   Bevelyn Ngo, NP  ondansetron (ZOFRAN) 4 MG tablet Take 1 tablet (4 mg total) by mouth every 6 (six) hours as needed for nausea. Patient not taking: Reported on 12/10/2016 11/13/16   Bevelyn Ngo, NP  pantoprazole (PROTONIX) 20 MG tablet Take 1 tablet (20 mg total) by mouth 2 (two) times daily. 11/13/16   Bevelyn Ngo, NP  rifaximin (XIFAXAN) 550 MG TABS tablet Take 1 tablet (550 mg total) by mouth 2 (two) times daily. 11/13/16   Bevelyn Ngo, NP  spironolactone (ALDACTONE) 25 MG tablet Take 1 tablet (25 mg total) by mouth daily. 11/13/16 11/13/17  Bevelyn Ngo, NP    Family History No family history on file.  Social History Social History   Tobacco Use  .  Smoking status: Never Smoker  . Smokeless tobacco: Never Used  Substance Use Topics  . Alcohol use: No  . Drug use: No     Allergies   Patient has no known allergies.   Review of Systems Review of Systems  Constitutional: Positive for fatigue. Negative for activity change and fever.  Respiratory: Positive for cough. Negative for shortness of breath.   Cardiovascular: Positive for chest pain.  Gastrointestinal: Negative for abdominal pain.  All other systems reviewed and are negative.    Physical  Exam Updated Vital Signs BP 140/87   Pulse 97   Temp 99.4 F (37.4 C) (Oral)   Resp 18   SpO2 97%   Physical Exam  Constitutional: He is oriented to person, place, and time. He appears well-nourished.  HENT:  Head: Normocephalic and atraumatic.  Eyes: Conjunctivae and EOM are normal.  Cardiovascular: Normal rate and regular rhythm.  No murmur heard. Pulmonary/Chest: Effort normal and breath sounds normal. No respiratory distress.  Musculoskeletal: Normal range of motion.  Neurological: He is alert and oriented to person, place, and time. No cranial nerve deficit.  Skin: Skin is warm and dry. He is not diaphoretic.  Psychiatric: He has a normal mood and affect. His behavior is normal.     ED Treatments / Results  Labs (all labs ordered are listed, but only abnormal results are displayed) Labs Reviewed  COMPREHENSIVE METABOLIC PANEL - Abnormal; Notable for the following components:      Result Value   Sodium 122 (*)    Chloride 94 (*)    CO2 20 (*)    Glucose, Bld 450 (*)    Calcium 7.9 (*)    Albumin 2.0 (*)    Total Bilirubin 3.4 (*)    All other components within normal limits  CBC WITH DIFFERENTIAL/PLATELET - Abnormal; Notable for the following components:   Platelets 81 (*)    All other components within normal limits  I-STAT CG4 LACTIC ACID, ED - Abnormal; Notable for the following components:   Lactic Acid, Venous 1.95 (*)    All other components within normal limits  I-STAT CG4 LACTIC ACID, ED    EKG  EKG Interpretation None       Radiology Dg Chest 2 View  Result Date: 02/13/2017 CLINICAL DATA:  Cough, fever arm vomiting starting yesterday EXAM: CHEST  2 VIEW COMPARISON:  November 19, 2016 FINDINGS: The mediastinal contour is normal. Heart size is probably enlarged. There is consolidation with pleural effusion of the left mid and lung base. The right lung is clear. Soft tissue and bony structures are stable. IMPRESSION: Consolidation of the left lung mid  an base with left pleural effusion. This is suspicious for pneumonia. Electronically Signed   By: Sherian ReinWei-Chen  Lin M.D.   On: 02/13/2017 11:23    Procedures Procedures (including critical care time)  Medications Ordered in ED Medications  sodium chloride 0.9 % bolus 1,000 mL (not administered)  levofloxacin (LEVAQUIN) tablet 750 mg (not administered)     Initial Impression / Assessment and Plan / ED Course  I have reviewed the triage vital signs and the nursing notes.  Pertinent labs & imaging results that were available during my care of the patient were reviewed by me and considered in my medical decision making (see chart for details).      Patient is a 51 year old male with past medical history significant for cirrhosis, about to be added [facility] the [facility] list [facility] liver [facility].  He  is presenting today with cough.  Patient had cough, left-sided chest pain with coughing.  Had low-grade fevers.  No recent hospitalization.  No shortness of breath.  2:58 PM Patient with community acquired pneumonia.  Will treat with Levaquin and cough syrup with codeine.  Discussed with pharmacy who agree with medication choices.  Corrected sodium is around 130  Patient eating and drinking normally, does not want IV.  Final Clinical Impressions(s) / ED Diagnoses   Final diagnoses:  None    ED Discharge Orders    None       Abelino DerrickMackuen, Trudee Chirino Lyn, MD 02/13/17 1527

## 2017-02-17 DIAGNOSIS — E119 Type 2 diabetes mellitus without complications: Secondary | ICD-10-CM | POA: Diagnosis not present

## 2017-02-17 DIAGNOSIS — Z452 Encounter for adjustment and management of vascular access device: Secondary | ICD-10-CM | POA: Diagnosis not present

## 2017-02-17 DIAGNOSIS — K7469 Other cirrhosis of liver: Secondary | ICD-10-CM | POA: Diagnosis not present

## 2017-02-17 DIAGNOSIS — B181 Chronic viral hepatitis B without delta-agent: Secondary | ICD-10-CM | POA: Diagnosis not present

## 2017-02-17 DIAGNOSIS — R9389 Abnormal findings on diagnostic imaging of other specified body structures: Secondary | ICD-10-CM | POA: Diagnosis not present

## 2017-02-17 DIAGNOSIS — Z95828 Presence of other vascular implants and grafts: Secondary | ICD-10-CM | POA: Diagnosis not present

## 2017-02-17 DIAGNOSIS — J811 Chronic pulmonary edema: Secondary | ICD-10-CM | POA: Diagnosis not present

## 2017-02-17 DIAGNOSIS — R918 Other nonspecific abnormal finding of lung field: Secondary | ICD-10-CM | POA: Diagnosis not present

## 2017-02-17 DIAGNOSIS — J9811 Atelectasis: Secondary | ICD-10-CM | POA: Diagnosis not present

## 2017-02-17 DIAGNOSIS — E871 Hypo-osmolality and hyponatremia: Secondary | ICD-10-CM | POA: Diagnosis not present

## 2017-02-17 DIAGNOSIS — K7460 Unspecified cirrhosis of liver: Secondary | ICD-10-CM | POA: Diagnosis not present

## 2017-02-17 DIAGNOSIS — D696 Thrombocytopenia, unspecified: Secondary | ICD-10-CM | POA: Diagnosis not present

## 2017-02-17 DIAGNOSIS — J918 Pleural effusion in other conditions classified elsewhere: Secondary | ICD-10-CM | POA: Diagnosis not present

## 2017-02-17 DIAGNOSIS — R0789 Other chest pain: Secondary | ICD-10-CM | POA: Diagnosis not present

## 2017-02-17 DIAGNOSIS — Z9889 Other specified postprocedural states: Secondary | ICD-10-CM | POA: Diagnosis not present

## 2017-02-17 DIAGNOSIS — J90 Pleural effusion, not elsewhere classified: Secondary | ICD-10-CM | POA: Diagnosis not present

## 2017-02-17 DIAGNOSIS — D684 Acquired coagulation factor deficiency: Secondary | ICD-10-CM | POA: Diagnosis not present

## 2017-02-17 DIAGNOSIS — J181 Lobar pneumonia, unspecified organism: Secondary | ICD-10-CM | POA: Diagnosis not present

## 2017-02-17 DIAGNOSIS — Z4682 Encounter for fitting and adjustment of non-vascular catheter: Secondary | ICD-10-CM | POA: Diagnosis not present

## 2017-02-17 DIAGNOSIS — I8510 Secondary esophageal varices without bleeding: Secondary | ICD-10-CM | POA: Diagnosis not present

## 2017-02-17 DIAGNOSIS — J189 Pneumonia, unspecified organism: Secondary | ICD-10-CM | POA: Diagnosis not present

## 2017-02-18 DIAGNOSIS — Z95828 Presence of other vascular implants and grafts: Secondary | ICD-10-CM | POA: Diagnosis not present

## 2017-02-18 DIAGNOSIS — J181 Lobar pneumonia, unspecified organism: Secondary | ICD-10-CM | POA: Diagnosis not present

## 2017-02-18 DIAGNOSIS — E871 Hypo-osmolality and hyponatremia: Secondary | ICD-10-CM | POA: Diagnosis not present

## 2017-02-18 DIAGNOSIS — B181 Chronic viral hepatitis B without delta-agent: Secondary | ICD-10-CM | POA: Diagnosis not present

## 2017-02-18 DIAGNOSIS — J90 Pleural effusion, not elsewhere classified: Secondary | ICD-10-CM | POA: Diagnosis not present

## 2017-02-18 DIAGNOSIS — K7460 Unspecified cirrhosis of liver: Secondary | ICD-10-CM | POA: Diagnosis not present

## 2017-02-19 DIAGNOSIS — R918 Other nonspecific abnormal finding of lung field: Secondary | ICD-10-CM | POA: Diagnosis not present

## 2017-02-19 DIAGNOSIS — J90 Pleural effusion, not elsewhere classified: Secondary | ICD-10-CM | POA: Diagnosis not present

## 2017-02-19 DIAGNOSIS — K7460 Unspecified cirrhosis of liver: Secondary | ICD-10-CM | POA: Diagnosis not present

## 2017-02-20 DIAGNOSIS — K7460 Unspecified cirrhosis of liver: Secondary | ICD-10-CM | POA: Diagnosis not present

## 2017-02-20 DIAGNOSIS — B181 Chronic viral hepatitis B without delta-agent: Secondary | ICD-10-CM | POA: Diagnosis not present

## 2017-02-20 DIAGNOSIS — Z95828 Presence of other vascular implants and grafts: Secondary | ICD-10-CM | POA: Diagnosis not present

## 2017-02-20 DIAGNOSIS — J90 Pleural effusion, not elsewhere classified: Secondary | ICD-10-CM | POA: Diagnosis not present

## 2017-02-20 DIAGNOSIS — J181 Lobar pneumonia, unspecified organism: Secondary | ICD-10-CM | POA: Diagnosis not present

## 2017-02-21 DIAGNOSIS — B181 Chronic viral hepatitis B without delta-agent: Secondary | ICD-10-CM | POA: Diagnosis not present

## 2017-02-21 DIAGNOSIS — J181 Lobar pneumonia, unspecified organism: Secondary | ICD-10-CM | POA: Diagnosis not present

## 2017-02-21 DIAGNOSIS — K7460 Unspecified cirrhosis of liver: Secondary | ICD-10-CM | POA: Diagnosis not present

## 2017-02-21 DIAGNOSIS — J90 Pleural effusion, not elsewhere classified: Secondary | ICD-10-CM | POA: Diagnosis not present

## 2017-02-22 DIAGNOSIS — J90 Pleural effusion, not elsewhere classified: Secondary | ICD-10-CM | POA: Diagnosis not present

## 2017-02-22 DIAGNOSIS — K7460 Unspecified cirrhosis of liver: Secondary | ICD-10-CM | POA: Diagnosis not present

## 2017-02-22 DIAGNOSIS — Z4682 Encounter for fitting and adjustment of non-vascular catheter: Secondary | ICD-10-CM | POA: Diagnosis not present

## 2017-02-22 DIAGNOSIS — R918 Other nonspecific abnormal finding of lung field: Secondary | ICD-10-CM | POA: Diagnosis not present

## 2017-02-22 DIAGNOSIS — J811 Chronic pulmonary edema: Secondary | ICD-10-CM | POA: Diagnosis not present

## 2017-02-22 DIAGNOSIS — B181 Chronic viral hepatitis B without delta-agent: Secondary | ICD-10-CM | POA: Diagnosis not present

## 2017-02-22 DIAGNOSIS — J181 Lobar pneumonia, unspecified organism: Secondary | ICD-10-CM | POA: Diagnosis not present

## 2017-02-23 DIAGNOSIS — J181 Lobar pneumonia, unspecified organism: Secondary | ICD-10-CM | POA: Diagnosis not present

## 2017-02-23 DIAGNOSIS — J90 Pleural effusion, not elsewhere classified: Secondary | ICD-10-CM | POA: Diagnosis not present

## 2017-02-23 DIAGNOSIS — B181 Chronic viral hepatitis B without delta-agent: Secondary | ICD-10-CM | POA: Diagnosis not present

## 2017-02-23 DIAGNOSIS — J9811 Atelectasis: Secondary | ICD-10-CM | POA: Diagnosis not present

## 2017-02-23 DIAGNOSIS — K7460 Unspecified cirrhosis of liver: Secondary | ICD-10-CM | POA: Diagnosis not present

## 2017-02-24 DIAGNOSIS — J181 Lobar pneumonia, unspecified organism: Secondary | ICD-10-CM | POA: Diagnosis not present

## 2017-02-24 DIAGNOSIS — K7460 Unspecified cirrhosis of liver: Secondary | ICD-10-CM | POA: Diagnosis not present

## 2017-02-24 DIAGNOSIS — J90 Pleural effusion, not elsewhere classified: Secondary | ICD-10-CM | POA: Diagnosis not present

## 2017-02-24 DIAGNOSIS — Z452 Encounter for adjustment and management of vascular access device: Secondary | ICD-10-CM | POA: Diagnosis not present

## 2017-02-24 DIAGNOSIS — B181 Chronic viral hepatitis B without delta-agent: Secondary | ICD-10-CM | POA: Diagnosis not present

## 2017-02-24 DIAGNOSIS — R918 Other nonspecific abnormal finding of lung field: Secondary | ICD-10-CM | POA: Diagnosis not present

## 2017-02-25 DIAGNOSIS — B181 Chronic viral hepatitis B without delta-agent: Secondary | ICD-10-CM | POA: Diagnosis not present

## 2017-02-25 DIAGNOSIS — R918 Other nonspecific abnormal finding of lung field: Secondary | ICD-10-CM | POA: Diagnosis not present

## 2017-02-25 DIAGNOSIS — J181 Lobar pneumonia, unspecified organism: Secondary | ICD-10-CM | POA: Diagnosis not present

## 2017-02-25 DIAGNOSIS — J90 Pleural effusion, not elsewhere classified: Secondary | ICD-10-CM | POA: Diagnosis not present

## 2017-02-25 DIAGNOSIS — K7460 Unspecified cirrhosis of liver: Secondary | ICD-10-CM | POA: Diagnosis not present

## 2017-02-26 DIAGNOSIS — K7460 Unspecified cirrhosis of liver: Secondary | ICD-10-CM | POA: Diagnosis not present

## 2017-02-26 DIAGNOSIS — Z95828 Presence of other vascular implants and grafts: Secondary | ICD-10-CM | POA: Diagnosis not present

## 2017-02-26 DIAGNOSIS — J90 Pleural effusion, not elsewhere classified: Secondary | ICD-10-CM | POA: Diagnosis not present

## 2017-02-26 DIAGNOSIS — J181 Lobar pneumonia, unspecified organism: Secondary | ICD-10-CM | POA: Diagnosis not present

## 2017-02-26 DIAGNOSIS — B181 Chronic viral hepatitis B without delta-agent: Secondary | ICD-10-CM | POA: Diagnosis not present

## 2017-02-26 DIAGNOSIS — R918 Other nonspecific abnormal finding of lung field: Secondary | ICD-10-CM | POA: Diagnosis not present

## 2017-02-26 DIAGNOSIS — J189 Pneumonia, unspecified organism: Secondary | ICD-10-CM | POA: Diagnosis not present

## 2017-02-27 DIAGNOSIS — B181 Chronic viral hepatitis B without delta-agent: Secondary | ICD-10-CM | POA: Diagnosis not present

## 2017-02-27 DIAGNOSIS — J90 Pleural effusion, not elsewhere classified: Secondary | ICD-10-CM | POA: Diagnosis not present

## 2017-02-27 DIAGNOSIS — Z4682 Encounter for fitting and adjustment of non-vascular catheter: Secondary | ICD-10-CM | POA: Diagnosis not present

## 2017-02-27 DIAGNOSIS — K7460 Unspecified cirrhosis of liver: Secondary | ICD-10-CM | POA: Diagnosis not present

## 2017-02-27 DIAGNOSIS — J9811 Atelectasis: Secondary | ICD-10-CM | POA: Diagnosis not present

## 2017-02-27 DIAGNOSIS — J181 Lobar pneumonia, unspecified organism: Secondary | ICD-10-CM | POA: Diagnosis not present

## 2017-02-28 DIAGNOSIS — J9811 Atelectasis: Secondary | ICD-10-CM | POA: Diagnosis not present

## 2017-02-28 DIAGNOSIS — J181 Lobar pneumonia, unspecified organism: Secondary | ICD-10-CM | POA: Diagnosis not present

## 2017-02-28 DIAGNOSIS — B181 Chronic viral hepatitis B without delta-agent: Secondary | ICD-10-CM | POA: Diagnosis not present

## 2017-02-28 DIAGNOSIS — K7460 Unspecified cirrhosis of liver: Secondary | ICD-10-CM | POA: Diagnosis not present

## 2017-02-28 DIAGNOSIS — J90 Pleural effusion, not elsewhere classified: Secondary | ICD-10-CM | POA: Diagnosis not present

## 2017-03-01 DIAGNOSIS — J90 Pleural effusion, not elsewhere classified: Secondary | ICD-10-CM | POA: Diagnosis not present

## 2017-03-01 DIAGNOSIS — B181 Chronic viral hepatitis B without delta-agent: Secondary | ICD-10-CM | POA: Diagnosis not present

## 2017-03-01 DIAGNOSIS — K7460 Unspecified cirrhosis of liver: Secondary | ICD-10-CM | POA: Diagnosis not present

## 2017-03-01 DIAGNOSIS — J181 Lobar pneumonia, unspecified organism: Secondary | ICD-10-CM | POA: Diagnosis not present

## 2017-03-02 DIAGNOSIS — J918 Pleural effusion in other conditions classified elsewhere: Secondary | ICD-10-CM | POA: Diagnosis not present

## 2017-03-02 DIAGNOSIS — R918 Other nonspecific abnormal finding of lung field: Secondary | ICD-10-CM | POA: Diagnosis not present

## 2017-03-02 DIAGNOSIS — J9811 Atelectasis: Secondary | ICD-10-CM | POA: Diagnosis not present

## 2017-03-02 DIAGNOSIS — B181 Chronic viral hepatitis B without delta-agent: Secondary | ICD-10-CM | POA: Diagnosis not present

## 2017-03-02 DIAGNOSIS — J189 Pneumonia, unspecified organism: Secondary | ICD-10-CM | POA: Diagnosis not present

## 2017-03-02 DIAGNOSIS — K7460 Unspecified cirrhosis of liver: Secondary | ICD-10-CM | POA: Diagnosis not present

## 2017-03-02 DIAGNOSIS — J90 Pleural effusion, not elsewhere classified: Secondary | ICD-10-CM | POA: Diagnosis not present

## 2017-03-02 DIAGNOSIS — J181 Lobar pneumonia, unspecified organism: Secondary | ICD-10-CM | POA: Diagnosis not present

## 2017-03-09 DIAGNOSIS — B181 Chronic viral hepatitis B without delta-agent: Secondary | ICD-10-CM | POA: Diagnosis not present

## 2017-03-09 DIAGNOSIS — J181 Lobar pneumonia, unspecified organism: Secondary | ICD-10-CM | POA: Diagnosis not present

## 2017-03-09 DIAGNOSIS — J90 Pleural effusion, not elsewhere classified: Secondary | ICD-10-CM | POA: Diagnosis not present

## 2017-03-09 DIAGNOSIS — E119 Type 2 diabetes mellitus without complications: Secondary | ICD-10-CM | POA: Diagnosis not present

## 2017-03-16 DIAGNOSIS — K7460 Unspecified cirrhosis of liver: Secondary | ICD-10-CM | POA: Diagnosis not present

## 2017-03-16 DIAGNOSIS — J181 Lobar pneumonia, unspecified organism: Secondary | ICD-10-CM | POA: Diagnosis not present

## 2017-03-16 DIAGNOSIS — Z23 Encounter for immunization: Secondary | ICD-10-CM | POA: Diagnosis not present

## 2017-03-16 DIAGNOSIS — B181 Chronic viral hepatitis B without delta-agent: Secondary | ICD-10-CM | POA: Diagnosis not present

## 2017-03-16 DIAGNOSIS — Z01818 Encounter for other preprocedural examination: Secondary | ICD-10-CM | POA: Diagnosis not present

## 2017-03-16 DIAGNOSIS — J918 Pleural effusion in other conditions classified elsewhere: Secondary | ICD-10-CM | POA: Diagnosis not present

## 2017-03-16 DIAGNOSIS — J189 Pneumonia, unspecified organism: Secondary | ICD-10-CM | POA: Diagnosis not present

## 2017-03-19 DIAGNOSIS — K7460 Unspecified cirrhosis of liver: Secondary | ICD-10-CM | POA: Diagnosis not present

## 2017-03-19 DIAGNOSIS — Z8701 Personal history of pneumonia (recurrent): Secondary | ICD-10-CM | POA: Diagnosis not present

## 2017-03-19 DIAGNOSIS — B181 Chronic viral hepatitis B without delta-agent: Secondary | ICD-10-CM | POA: Diagnosis not present

## 2017-03-19 DIAGNOSIS — J90 Pleural effusion, not elsewhere classified: Secondary | ICD-10-CM | POA: Diagnosis not present

## 2017-03-19 DIAGNOSIS — J9811 Atelectasis: Secondary | ICD-10-CM | POA: Diagnosis not present

## 2017-03-19 DIAGNOSIS — J189 Pneumonia, unspecified organism: Secondary | ICD-10-CM | POA: Diagnosis not present

## 2017-03-19 DIAGNOSIS — J181 Lobar pneumonia, unspecified organism: Secondary | ICD-10-CM | POA: Diagnosis not present

## 2017-03-19 DIAGNOSIS — J918 Pleural effusion in other conditions classified elsewhere: Secondary | ICD-10-CM | POA: Diagnosis not present

## 2017-03-29 DIAGNOSIS — J90 Pleural effusion, not elsewhere classified: Secondary | ICD-10-CM | POA: Diagnosis not present

## 2017-04-02 DIAGNOSIS — J189 Pneumonia, unspecified organism: Secondary | ICD-10-CM | POA: Diagnosis not present

## 2017-04-02 DIAGNOSIS — Z008 Encounter for other general examination: Secondary | ICD-10-CM | POA: Diagnosis not present

## 2017-04-02 DIAGNOSIS — Z7682 Awaiting organ transplant status: Secondary | ICD-10-CM | POA: Diagnosis not present

## 2017-04-02 DIAGNOSIS — Z01818 Encounter for other preprocedural examination: Secondary | ICD-10-CM | POA: Diagnosis not present

## 2017-04-02 DIAGNOSIS — J90 Pleural effusion, not elsewhere classified: Secondary | ICD-10-CM | POA: Diagnosis not present

## 2017-04-02 DIAGNOSIS — Z95828 Presence of other vascular implants and grafts: Secondary | ICD-10-CM | POA: Diagnosis not present

## 2017-04-02 DIAGNOSIS — J918 Pleural effusion in other conditions classified elsewhere: Secondary | ICD-10-CM | POA: Diagnosis not present

## 2017-04-02 DIAGNOSIS — K7469 Other cirrhosis of liver: Secondary | ICD-10-CM | POA: Diagnosis not present

## 2017-04-02 DIAGNOSIS — B169 Acute hepatitis B without delta-agent and without hepatic coma: Secondary | ICD-10-CM | POA: Diagnosis not present

## 2017-04-08 DIAGNOSIS — J90 Pleural effusion, not elsewhere classified: Secondary | ICD-10-CM | POA: Diagnosis not present

## 2017-04-08 DIAGNOSIS — M546 Pain in thoracic spine: Secondary | ICD-10-CM | POA: Diagnosis not present

## 2017-04-27 DIAGNOSIS — B181 Chronic viral hepatitis B without delta-agent: Secondary | ICD-10-CM | POA: Diagnosis not present

## 2017-04-27 DIAGNOSIS — K7460 Unspecified cirrhosis of liver: Secondary | ICD-10-CM | POA: Diagnosis not present

## 2017-06-07 DIAGNOSIS — R05 Cough: Secondary | ICD-10-CM | POA: Diagnosis not present

## 2017-06-07 DIAGNOSIS — J069 Acute upper respiratory infection, unspecified: Secondary | ICD-10-CM | POA: Diagnosis not present

## 2017-06-09 ENCOUNTER — Other Ambulatory Visit: Payer: Self-pay | Admitting: Interventional Radiology

## 2017-06-09 DIAGNOSIS — I8500 Esophageal varices without bleeding: Secondary | ICD-10-CM

## 2017-07-29 DIAGNOSIS — Z23 Encounter for immunization: Secondary | ICD-10-CM | POA: Diagnosis not present

## 2017-07-29 DIAGNOSIS — Z713 Dietary counseling and surveillance: Secondary | ICD-10-CM | POA: Diagnosis not present

## 2017-07-29 DIAGNOSIS — B1910 Unspecified viral hepatitis B without hepatic coma: Secondary | ICD-10-CM | POA: Diagnosis not present

## 2017-07-29 DIAGNOSIS — K766 Portal hypertension: Secondary | ICD-10-CM | POA: Diagnosis not present

## 2017-07-29 DIAGNOSIS — Z95828 Presence of other vascular implants and grafts: Secondary | ICD-10-CM | POA: Diagnosis not present

## 2017-07-29 DIAGNOSIS — J90 Pleural effusion, not elsewhere classified: Secondary | ICD-10-CM | POA: Diagnosis not present

## 2017-07-29 DIAGNOSIS — Z01818 Encounter for other preprocedural examination: Secondary | ICD-10-CM | POA: Diagnosis not present

## 2017-07-29 DIAGNOSIS — B181 Chronic viral hepatitis B without delta-agent: Secondary | ICD-10-CM | POA: Diagnosis not present

## 2017-07-29 DIAGNOSIS — K7460 Unspecified cirrhosis of liver: Secondary | ICD-10-CM | POA: Diagnosis not present

## 2017-08-12 DIAGNOSIS — B181 Chronic viral hepatitis B without delta-agent: Secondary | ICD-10-CM | POA: Diagnosis not present

## 2017-08-12 DIAGNOSIS — Z95828 Presence of other vascular implants and grafts: Secondary | ICD-10-CM | POA: Diagnosis not present

## 2017-08-12 DIAGNOSIS — K766 Portal hypertension: Secondary | ICD-10-CM | POA: Diagnosis not present

## 2017-08-12 DIAGNOSIS — K7460 Unspecified cirrhosis of liver: Secondary | ICD-10-CM | POA: Diagnosis not present

## 2017-08-23 DIAGNOSIS — J479 Bronchiectasis, uncomplicated: Secondary | ICD-10-CM | POA: Diagnosis not present

## 2017-08-23 DIAGNOSIS — Z01818 Encounter for other preprocedural examination: Secondary | ICD-10-CM | POA: Diagnosis not present

## 2017-08-23 DIAGNOSIS — Z7682 Awaiting organ transplant status: Secondary | ICD-10-CM | POA: Diagnosis not present

## 2017-08-23 DIAGNOSIS — K7460 Unspecified cirrhosis of liver: Secondary | ICD-10-CM | POA: Diagnosis not present

## 2017-08-23 DIAGNOSIS — J449 Chronic obstructive pulmonary disease, unspecified: Secondary | ICD-10-CM | POA: Diagnosis not present

## 2017-08-23 DIAGNOSIS — B181 Chronic viral hepatitis B without delta-agent: Secondary | ICD-10-CM | POA: Diagnosis not present

## 2017-09-14 ENCOUNTER — Encounter: Payer: Self-pay | Admitting: *Deleted

## 2017-09-24 DIAGNOSIS — G9340 Encephalopathy, unspecified: Secondary | ICD-10-CM | POA: Diagnosis not present

## 2017-09-24 DIAGNOSIS — Z95828 Presence of other vascular implants and grafts: Secondary | ICD-10-CM | POA: Diagnosis not present

## 2017-09-24 DIAGNOSIS — B181 Chronic viral hepatitis B without delta-agent: Secondary | ICD-10-CM | POA: Diagnosis not present

## 2017-09-24 DIAGNOSIS — Z23 Encounter for immunization: Secondary | ICD-10-CM | POA: Diagnosis not present

## 2017-09-24 DIAGNOSIS — Z7682 Awaiting organ transplant status: Secondary | ICD-10-CM | POA: Diagnosis not present

## 2017-09-24 DIAGNOSIS — K7460 Unspecified cirrhosis of liver: Secondary | ICD-10-CM | POA: Diagnosis not present

## 2017-10-25 DIAGNOSIS — R9431 Abnormal electrocardiogram [ECG] [EKG]: Secondary | ICD-10-CM | POA: Diagnosis not present

## 2017-10-25 DIAGNOSIS — J9811 Atelectasis: Secondary | ICD-10-CM | POA: Diagnosis not present

## 2017-10-25 DIAGNOSIS — R918 Other nonspecific abnormal finding of lung field: Secondary | ICD-10-CM | POA: Diagnosis not present

## 2017-10-25 DIAGNOSIS — B181 Chronic viral hepatitis B without delta-agent: Secondary | ICD-10-CM | POA: Diagnosis not present

## 2017-10-25 DIAGNOSIS — I517 Cardiomegaly: Secondary | ICD-10-CM | POA: Diagnosis not present

## 2017-10-25 DIAGNOSIS — Z95828 Presence of other vascular implants and grafts: Secondary | ICD-10-CM | POA: Diagnosis not present

## 2017-10-25 DIAGNOSIS — K7460 Unspecified cirrhosis of liver: Secondary | ICD-10-CM | POA: Diagnosis not present

## 2017-10-25 DIAGNOSIS — Z01818 Encounter for other preprocedural examination: Secondary | ICD-10-CM | POA: Diagnosis not present

## 2017-10-25 DIAGNOSIS — I8511 Secondary esophageal varices with bleeding: Secondary | ICD-10-CM | POA: Diagnosis not present

## 2017-10-25 DIAGNOSIS — Z6720 Type B blood, Rh positive: Secondary | ICD-10-CM | POA: Diagnosis not present

## 2017-10-25 DIAGNOSIS — Z7682 Awaiting organ transplant status: Secondary | ICD-10-CM | POA: Diagnosis not present

## 2017-10-30 DIAGNOSIS — R918 Other nonspecific abnormal finding of lung field: Secondary | ICD-10-CM | POA: Diagnosis not present

## 2017-10-30 DIAGNOSIS — B181 Chronic viral hepatitis B without delta-agent: Secondary | ICD-10-CM | POA: Diagnosis not present

## 2017-10-30 DIAGNOSIS — I8511 Secondary esophageal varices with bleeding: Secondary | ICD-10-CM | POA: Diagnosis not present

## 2017-10-30 DIAGNOSIS — I517 Cardiomegaly: Secondary | ICD-10-CM | POA: Diagnosis not present

## 2017-10-30 DIAGNOSIS — I8501 Esophageal varices with bleeding: Secondary | ICD-10-CM | POA: Diagnosis not present

## 2017-10-30 DIAGNOSIS — K7460 Unspecified cirrhosis of liver: Secondary | ICD-10-CM | POA: Diagnosis not present

## 2017-10-30 DIAGNOSIS — E119 Type 2 diabetes mellitus without complications: Secondary | ICD-10-CM | POA: Diagnosis not present

## 2017-10-30 DIAGNOSIS — Z95828 Presence of other vascular implants and grafts: Secondary | ICD-10-CM | POA: Diagnosis not present

## 2017-10-30 DIAGNOSIS — Z7682 Awaiting organ transplant status: Secondary | ICD-10-CM | POA: Diagnosis not present

## 2017-11-04 DIAGNOSIS — K7460 Unspecified cirrhosis of liver: Secondary | ICD-10-CM | POA: Diagnosis not present

## 2017-11-04 DIAGNOSIS — R9389 Abnormal findings on diagnostic imaging of other specified body structures: Secondary | ICD-10-CM | POA: Diagnosis not present

## 2017-11-04 DIAGNOSIS — R918 Other nonspecific abnormal finding of lung field: Secondary | ICD-10-CM | POA: Diagnosis not present

## 2017-11-04 DIAGNOSIS — J84113 Idiopathic non-specific interstitial pneumonitis: Secondary | ICD-10-CM | POA: Diagnosis not present

## 2017-11-15 DIAGNOSIS — R1312 Dysphagia, oropharyngeal phase: Secondary | ICD-10-CM | POA: Diagnosis not present

## 2017-11-26 DIAGNOSIS — J479 Bronchiectasis, uncomplicated: Secondary | ICD-10-CM | POA: Diagnosis not present

## 2017-12-02 DIAGNOSIS — K7460 Unspecified cirrhosis of liver: Secondary | ICD-10-CM | POA: Diagnosis not present

## 2017-12-02 DIAGNOSIS — J918 Pleural effusion in other conditions classified elsewhere: Secondary | ICD-10-CM | POA: Diagnosis not present

## 2017-12-02 DIAGNOSIS — B181 Chronic viral hepatitis B without delta-agent: Secondary | ICD-10-CM | POA: Diagnosis not present

## 2017-12-02 DIAGNOSIS — J189 Pneumonia, unspecified organism: Secondary | ICD-10-CM | POA: Diagnosis not present

## 2017-12-02 DIAGNOSIS — J181 Lobar pneumonia, unspecified organism: Secondary | ICD-10-CM | POA: Diagnosis not present

## 2017-12-02 DIAGNOSIS — Z7682 Awaiting organ transplant status: Secondary | ICD-10-CM | POA: Diagnosis not present

## 2017-12-02 DIAGNOSIS — D696 Thrombocytopenia, unspecified: Secondary | ICD-10-CM | POA: Diagnosis not present

## 2017-12-02 DIAGNOSIS — Z01818 Encounter for other preprocedural examination: Secondary | ICD-10-CM | POA: Diagnosis not present

## 2017-12-03 DIAGNOSIS — R911 Solitary pulmonary nodule: Secondary | ICD-10-CM | POA: Diagnosis not present

## 2017-12-03 DIAGNOSIS — D696 Thrombocytopenia, unspecified: Secondary | ICD-10-CM | POA: Diagnosis not present

## 2017-12-03 DIAGNOSIS — E119 Type 2 diabetes mellitus without complications: Secondary | ICD-10-CM | POA: Diagnosis not present

## 2017-12-03 DIAGNOSIS — Z8619 Personal history of other infectious and parasitic diseases: Secondary | ICD-10-CM | POA: Diagnosis not present

## 2017-12-03 DIAGNOSIS — J219 Acute bronchiolitis, unspecified: Secondary | ICD-10-CM | POA: Diagnosis not present

## 2017-12-03 DIAGNOSIS — Z79899 Other long term (current) drug therapy: Secondary | ICD-10-CM | POA: Diagnosis not present

## 2017-12-03 DIAGNOSIS — J9811 Atelectasis: Secondary | ICD-10-CM | POA: Diagnosis not present

## 2018-01-17 DIAGNOSIS — R05 Cough: Secondary | ICD-10-CM | POA: Diagnosis not present

## 2018-01-25 DIAGNOSIS — K7460 Unspecified cirrhosis of liver: Secondary | ICD-10-CM | POA: Diagnosis not present

## 2018-01-25 DIAGNOSIS — Z95828 Presence of other vascular implants and grafts: Secondary | ICD-10-CM | POA: Diagnosis not present

## 2018-01-25 DIAGNOSIS — Z01818 Encounter for other preprocedural examination: Secondary | ICD-10-CM | POA: Diagnosis not present

## 2018-01-25 DIAGNOSIS — Z713 Dietary counseling and surveillance: Secondary | ICD-10-CM | POA: Diagnosis not present

## 2018-01-25 DIAGNOSIS — Z6824 Body mass index (BMI) 24.0-24.9, adult: Secondary | ICD-10-CM | POA: Diagnosis not present

## 2018-01-25 DIAGNOSIS — R05 Cough: Secondary | ICD-10-CM | POA: Diagnosis not present

## 2018-01-25 DIAGNOSIS — I8501 Esophageal varices with bleeding: Secondary | ICD-10-CM | POA: Diagnosis not present

## 2018-01-25 DIAGNOSIS — B181 Chronic viral hepatitis B without delta-agent: Secondary | ICD-10-CM | POA: Diagnosis not present

## 2018-01-25 DIAGNOSIS — K7290 Hepatic failure, unspecified without coma: Secondary | ICD-10-CM | POA: Diagnosis not present

## 2018-01-25 DIAGNOSIS — J690 Pneumonitis due to inhalation of food and vomit: Secondary | ICD-10-CM | POA: Diagnosis not present

## 2018-01-25 DIAGNOSIS — K766 Portal hypertension: Secondary | ICD-10-CM | POA: Diagnosis not present

## 2018-01-25 DIAGNOSIS — Z7682 Awaiting organ transplant status: Secondary | ICD-10-CM | POA: Diagnosis not present

## 2018-01-25 DIAGNOSIS — R1110 Vomiting, unspecified: Secondary | ICD-10-CM | POA: Diagnosis not present

## 2018-01-25 DIAGNOSIS — Z79899 Other long term (current) drug therapy: Secondary | ICD-10-CM | POA: Diagnosis not present

## 2018-02-01 DIAGNOSIS — K7460 Unspecified cirrhosis of liver: Secondary | ICD-10-CM | POA: Diagnosis not present

## 2018-02-01 DIAGNOSIS — Z01818 Encounter for other preprocedural examination: Secondary | ICD-10-CM | POA: Diagnosis not present

## 2018-02-01 DIAGNOSIS — I493 Ventricular premature depolarization: Secondary | ICD-10-CM | POA: Diagnosis not present

## 2018-02-01 DIAGNOSIS — I517 Cardiomegaly: Secondary | ICD-10-CM | POA: Diagnosis not present

## 2018-02-01 DIAGNOSIS — I491 Atrial premature depolarization: Secondary | ICD-10-CM | POA: Diagnosis not present

## 2018-02-01 DIAGNOSIS — R9431 Abnormal electrocardiogram [ECG] [EKG]: Secondary | ICD-10-CM | POA: Diagnosis not present

## 2018-02-01 DIAGNOSIS — B181 Chronic viral hepatitis B without delta-agent: Secondary | ICD-10-CM | POA: Diagnosis not present

## 2018-02-01 DIAGNOSIS — Z01810 Encounter for preprocedural cardiovascular examination: Secondary | ICD-10-CM | POA: Diagnosis not present

## 2018-02-17 DIAGNOSIS — K219 Gastro-esophageal reflux disease without esophagitis: Secondary | ICD-10-CM | POA: Diagnosis not present

## 2018-02-17 DIAGNOSIS — Z79899 Other long term (current) drug therapy: Secondary | ICD-10-CM | POA: Diagnosis not present

## 2018-02-17 DIAGNOSIS — K7460 Unspecified cirrhosis of liver: Secondary | ICD-10-CM | POA: Diagnosis not present

## 2018-02-17 DIAGNOSIS — Z5181 Encounter for therapeutic drug level monitoring: Secondary | ICD-10-CM | POA: Diagnosis not present

## 2018-02-17 DIAGNOSIS — Z7682 Awaiting organ transplant status: Secondary | ICD-10-CM | POA: Diagnosis not present

## 2018-02-17 DIAGNOSIS — D696 Thrombocytopenia, unspecified: Secondary | ICD-10-CM | POA: Diagnosis not present

## 2018-02-17 DIAGNOSIS — I517 Cardiomegaly: Secondary | ICD-10-CM | POA: Diagnosis not present

## 2018-02-17 DIAGNOSIS — E871 Hypo-osmolality and hyponatremia: Secondary | ICD-10-CM | POA: Diagnosis not present

## 2018-02-17 DIAGNOSIS — I8511 Secondary esophageal varices with bleeding: Secondary | ICD-10-CM | POA: Diagnosis not present

## 2018-02-17 DIAGNOSIS — R918 Other nonspecific abnormal finding of lung field: Secondary | ICD-10-CM | POA: Diagnosis not present

## 2018-02-17 DIAGNOSIS — B181 Chronic viral hepatitis B without delta-agent: Secondary | ICD-10-CM | POA: Diagnosis not present

## 2018-02-17 DIAGNOSIS — E119 Type 2 diabetes mellitus without complications: Secondary | ICD-10-CM | POA: Diagnosis not present

## 2018-04-11 DIAGNOSIS — R42 Dizziness and giddiness: Secondary | ICD-10-CM | POA: Diagnosis not present

## 2018-05-06 DIAGNOSIS — B181 Chronic viral hepatitis B without delta-agent: Secondary | ICD-10-CM | POA: Diagnosis not present

## 2018-05-06 DIAGNOSIS — K7460 Unspecified cirrhosis of liver: Secondary | ICD-10-CM | POA: Diagnosis not present

## 2018-06-17 DIAGNOSIS — B181 Chronic viral hepatitis B without delta-agent: Secondary | ICD-10-CM | POA: Diagnosis not present

## 2018-06-17 DIAGNOSIS — K7460 Unspecified cirrhosis of liver: Secondary | ICD-10-CM | POA: Diagnosis not present

## 2018-07-28 DIAGNOSIS — R918 Other nonspecific abnormal finding of lung field: Secondary | ICD-10-CM | POA: Diagnosis not present

## 2018-07-28 DIAGNOSIS — K766 Portal hypertension: Secondary | ICD-10-CM | POA: Diagnosis not present

## 2018-07-28 DIAGNOSIS — B181 Chronic viral hepatitis B without delta-agent: Secondary | ICD-10-CM | POA: Diagnosis not present

## 2018-07-28 DIAGNOSIS — C220 Liver cell carcinoma: Secondary | ICD-10-CM | POA: Diagnosis not present

## 2018-07-28 DIAGNOSIS — Z95828 Presence of other vascular implants and grafts: Secondary | ICD-10-CM | POA: Diagnosis not present

## 2018-07-28 DIAGNOSIS — K7460 Unspecified cirrhosis of liver: Secondary | ICD-10-CM | POA: Diagnosis not present

## 2018-07-31 DIAGNOSIS — C220 Liver cell carcinoma: Secondary | ICD-10-CM | POA: Diagnosis not present

## 2018-08-08 DIAGNOSIS — C220 Liver cell carcinoma: Secondary | ICD-10-CM | POA: Diagnosis not present

## 2018-08-08 DIAGNOSIS — R918 Other nonspecific abnormal finding of lung field: Secondary | ICD-10-CM | POA: Diagnosis not present

## 2018-09-01 DIAGNOSIS — N179 Acute kidney failure, unspecified: Secondary | ICD-10-CM | POA: Diagnosis not present

## 2018-09-01 DIAGNOSIS — Z944 Liver transplant status: Secondary | ICD-10-CM | POA: Diagnosis not present

## 2018-09-01 DIAGNOSIS — K766 Portal hypertension: Secondary | ICD-10-CM | POA: Diagnosis not present

## 2018-09-01 DIAGNOSIS — K3189 Other diseases of stomach and duodenum: Secondary | ICD-10-CM | POA: Diagnosis not present

## 2018-09-01 DIAGNOSIS — C220 Liver cell carcinoma: Secondary | ICD-10-CM | POA: Diagnosis not present

## 2018-09-01 DIAGNOSIS — D899 Disorder involving the immune mechanism, unspecified: Secondary | ICD-10-CM | POA: Diagnosis not present

## 2018-09-01 DIAGNOSIS — R109 Unspecified abdominal pain: Secondary | ICD-10-CM | POA: Diagnosis not present

## 2018-09-01 DIAGNOSIS — Z1159 Encounter for screening for other viral diseases: Secondary | ICD-10-CM | POA: Diagnosis not present

## 2018-09-01 DIAGNOSIS — T8643 Liver transplant infection: Secondary | ICD-10-CM | POA: Diagnosis not present

## 2018-09-01 DIAGNOSIS — Z4823 Encounter for aftercare following liver transplant: Secondary | ICD-10-CM | POA: Diagnosis not present

## 2018-09-01 DIAGNOSIS — D62 Acute posthemorrhagic anemia: Secondary | ICD-10-CM | POA: Diagnosis not present

## 2018-09-01 DIAGNOSIS — D696 Thrombocytopenia, unspecified: Secondary | ICD-10-CM | POA: Diagnosis not present

## 2018-09-01 DIAGNOSIS — B181 Chronic viral hepatitis B without delta-agent: Secondary | ICD-10-CM | POA: Diagnosis not present

## 2018-09-01 DIAGNOSIS — K7469 Other cirrhosis of liver: Secondary | ICD-10-CM | POA: Diagnosis not present

## 2018-09-01 DIAGNOSIS — I120 Hypertensive chronic kidney disease with stage 5 chronic kidney disease or end stage renal disease: Secondary | ICD-10-CM | POA: Diagnosis not present

## 2018-09-01 DIAGNOSIS — K91870 Postprocedural hematoma of a digestive system organ or structure following a digestive system procedure: Secondary | ICD-10-CM | POA: Diagnosis not present

## 2018-09-01 DIAGNOSIS — I8510 Secondary esophageal varices without bleeding: Secondary | ICD-10-CM | POA: Diagnosis not present

## 2018-09-01 DIAGNOSIS — N186 End stage renal disease: Secondary | ICD-10-CM | POA: Diagnosis not present

## 2018-09-01 DIAGNOSIS — R578 Other shock: Secondary | ICD-10-CM | POA: Diagnosis not present

## 2018-09-01 DIAGNOSIS — J95821 Acute postprocedural respiratory failure: Secondary | ICD-10-CM | POA: Diagnosis not present

## 2018-09-01 DIAGNOSIS — E1165 Type 2 diabetes mellitus with hyperglycemia: Secondary | ICD-10-CM | POA: Diagnosis not present

## 2018-09-01 DIAGNOSIS — T380X5A Adverse effect of glucocorticoids and synthetic analogues, initial encounter: Secondary | ICD-10-CM | POA: Diagnosis not present

## 2018-09-14 DIAGNOSIS — Z944 Liver transplant status: Secondary | ICD-10-CM | POA: Diagnosis not present

## 2018-09-14 DIAGNOSIS — R109 Unspecified abdominal pain: Secondary | ICD-10-CM | POA: Diagnosis not present

## 2018-09-17 IMAGING — DX DG CHEST 1V PORT
1 series · 1 of 1 positions shown · non-contrast
Comparison: 11/06/2016

CLINICAL DATA: Intubation

EXAM:
PORTABLE CHEST 1 VIEW

[chest ap]
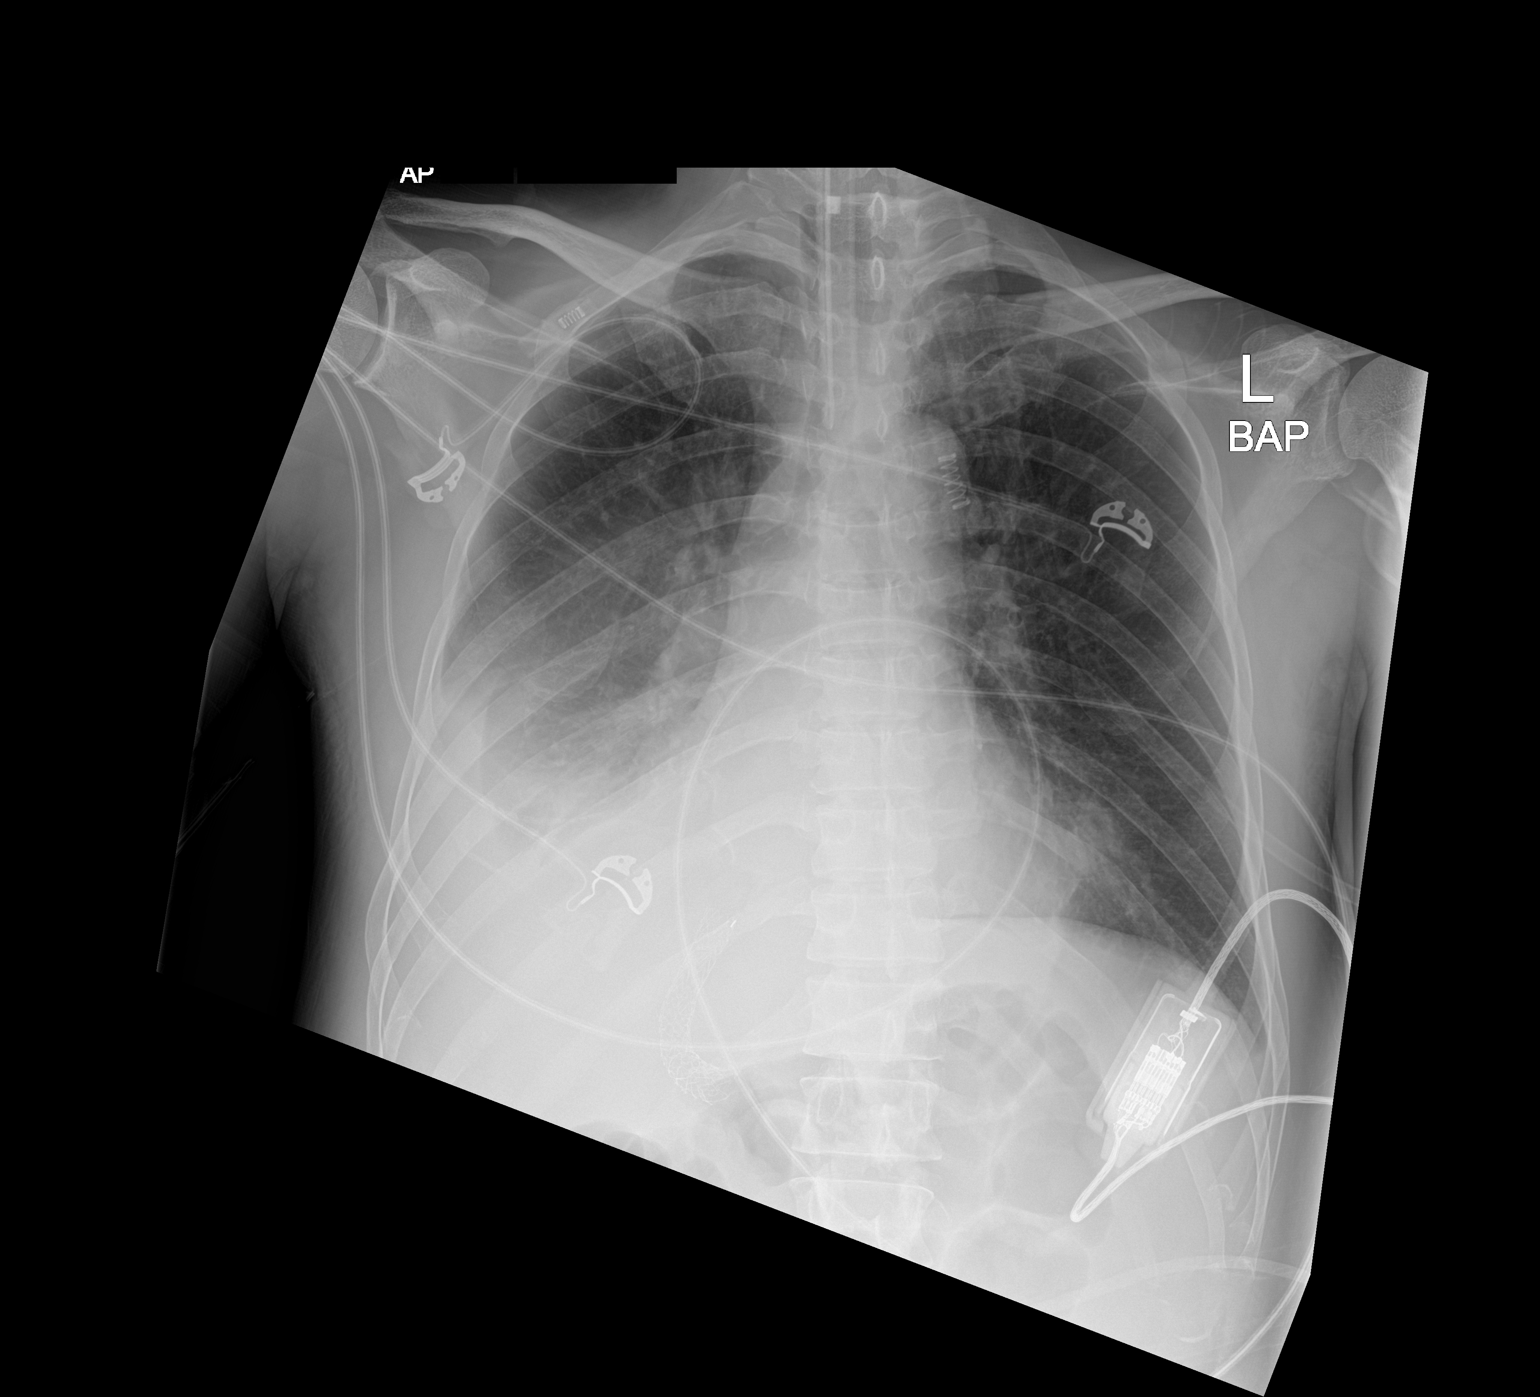

[1 of 1 positions shown; findings below may reference images not displayed]

FINDINGS: Interval placement endotracheal tube with tip measuring 3.4 cm above
the carina. Shallow inspiration. Heart size and pulmonary
vascularity are normal. There is a developing right pleural effusion
with basilar atelectasis or infiltration in the right lung. This
could represent developing pneumonia. Left lung is clear. No
pneumothorax. Stent in the right upper quadrant likely representing
a biliary stent or possibly tips shunt.
IMPRESSION: Endotracheal tube tip measures 3.4 cm above the carina. Developing
right pleural effusion with atelectasis or infiltration in the right
lung base, possibly indicating pneumonia.

## 2018-09-19 IMAGING — CR DG CHEST 1V PORT
1 series · 1 of 1 positions shown · non-contrast
Comparison: 11/06/2016

CLINICAL DATA: Respiratory failure

EXAM:
PORTABLE CHEST 1 VIEW

[AP]
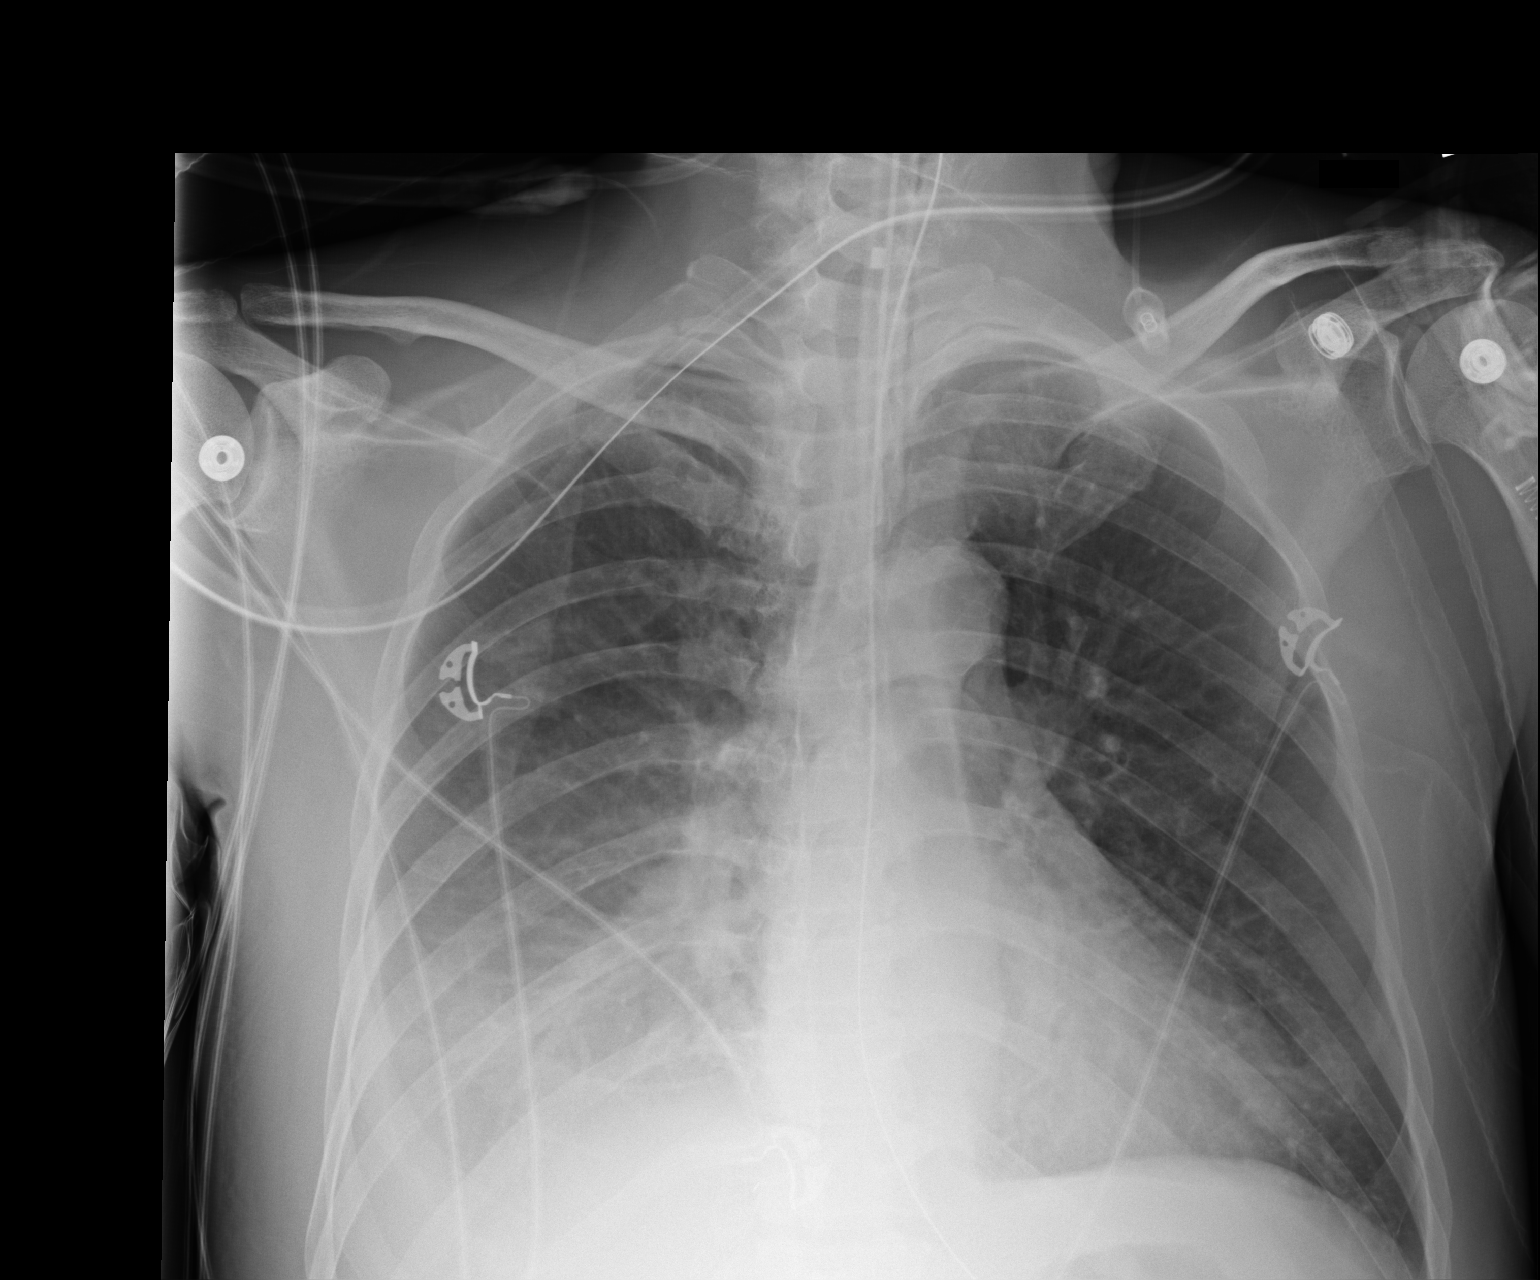

[1 of 1 positions shown; findings below may reference images not displayed]

FINDINGS: Endotracheal tube and nasogastric catheter are noted in satisfactory
position. Cardiac shadow is stable. Left lung remains clear. Slight
improved aeration is noted in the right base. Right-sided pleural
effusion and basilar infiltrate remains.
IMPRESSION: Improving aeration in the right base although persistent infiltrate
and effusion remain.

## 2018-09-20 IMAGING — CR DG CHEST 1V PORT
1 series · 1 of 1 positions shown · non-contrast
Comparison: 11/08/2016

CLINICAL DATA: Acute respiratory failure

EXAM:
PORTABLE CHEST 1 VIEW

[AP]
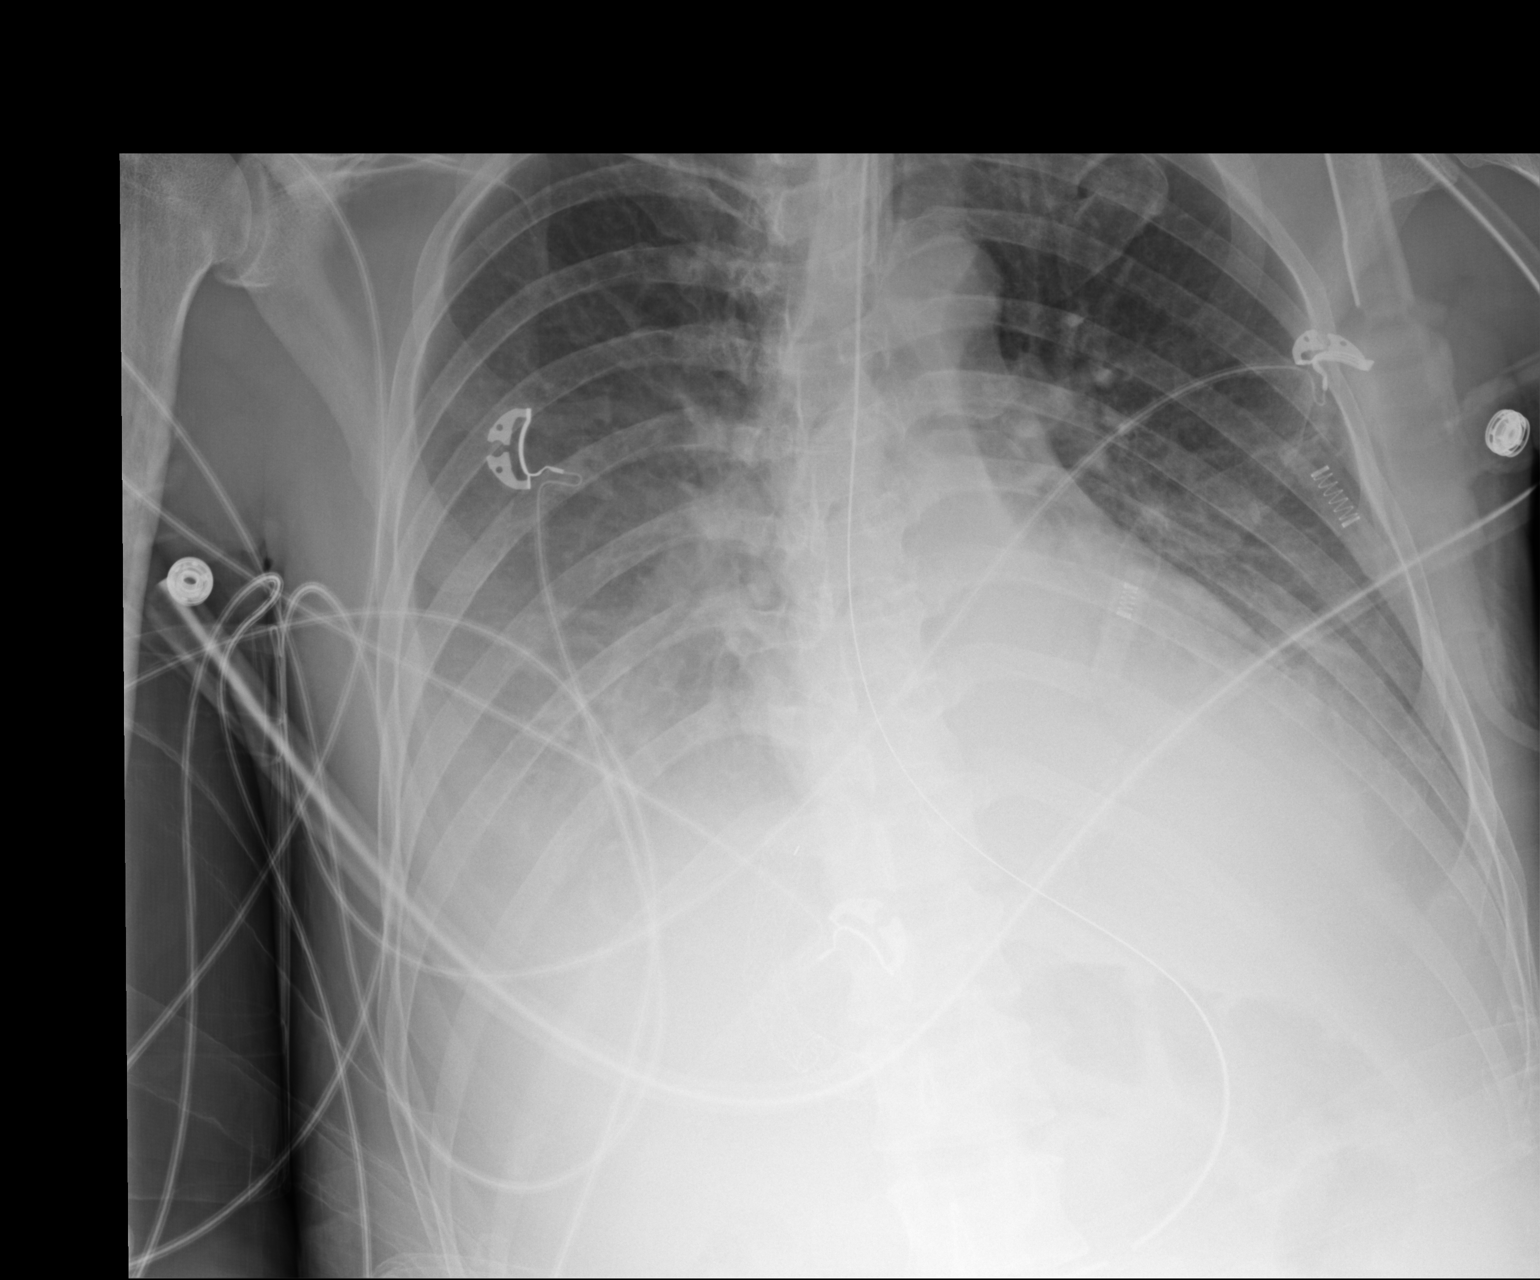

[1 of 1 positions shown; findings below may reference images not displayed]

FINDINGS: Endotracheal tube terminates 2.9 cm above carina.Nasogastric tube
extends beyond the inferior aspect of the film. Patient rotated to
the left. Midline trachea. Normal heart size for level of
inspiration. Persistent right and developing layering left pleural
effusion. No pneumothorax. Low lung volumes, accentuating mild
pulmonary venous congestion. Similar right and new or increased left
base airspace disease.
IMPRESSION: Worsened aeration, with developing left pleural effusion and
adjacent atelectasis or infection.

Similar right-sided aeration with effusion and airspace disease.

Mild pulmonary venous congestion.

## 2018-09-21 DIAGNOSIS — C220 Liver cell carcinoma: Secondary | ICD-10-CM | POA: Diagnosis not present

## 2018-09-21 IMAGING — DX DG CHEST 1V PORT
1 series · 1 of 1 positions shown · non-contrast
Comparison: 11/09/2016.

CLINICAL DATA: Respiratory acidosis.

EXAM:
PORTABLE CHEST 1 VIEW

[chest ap]
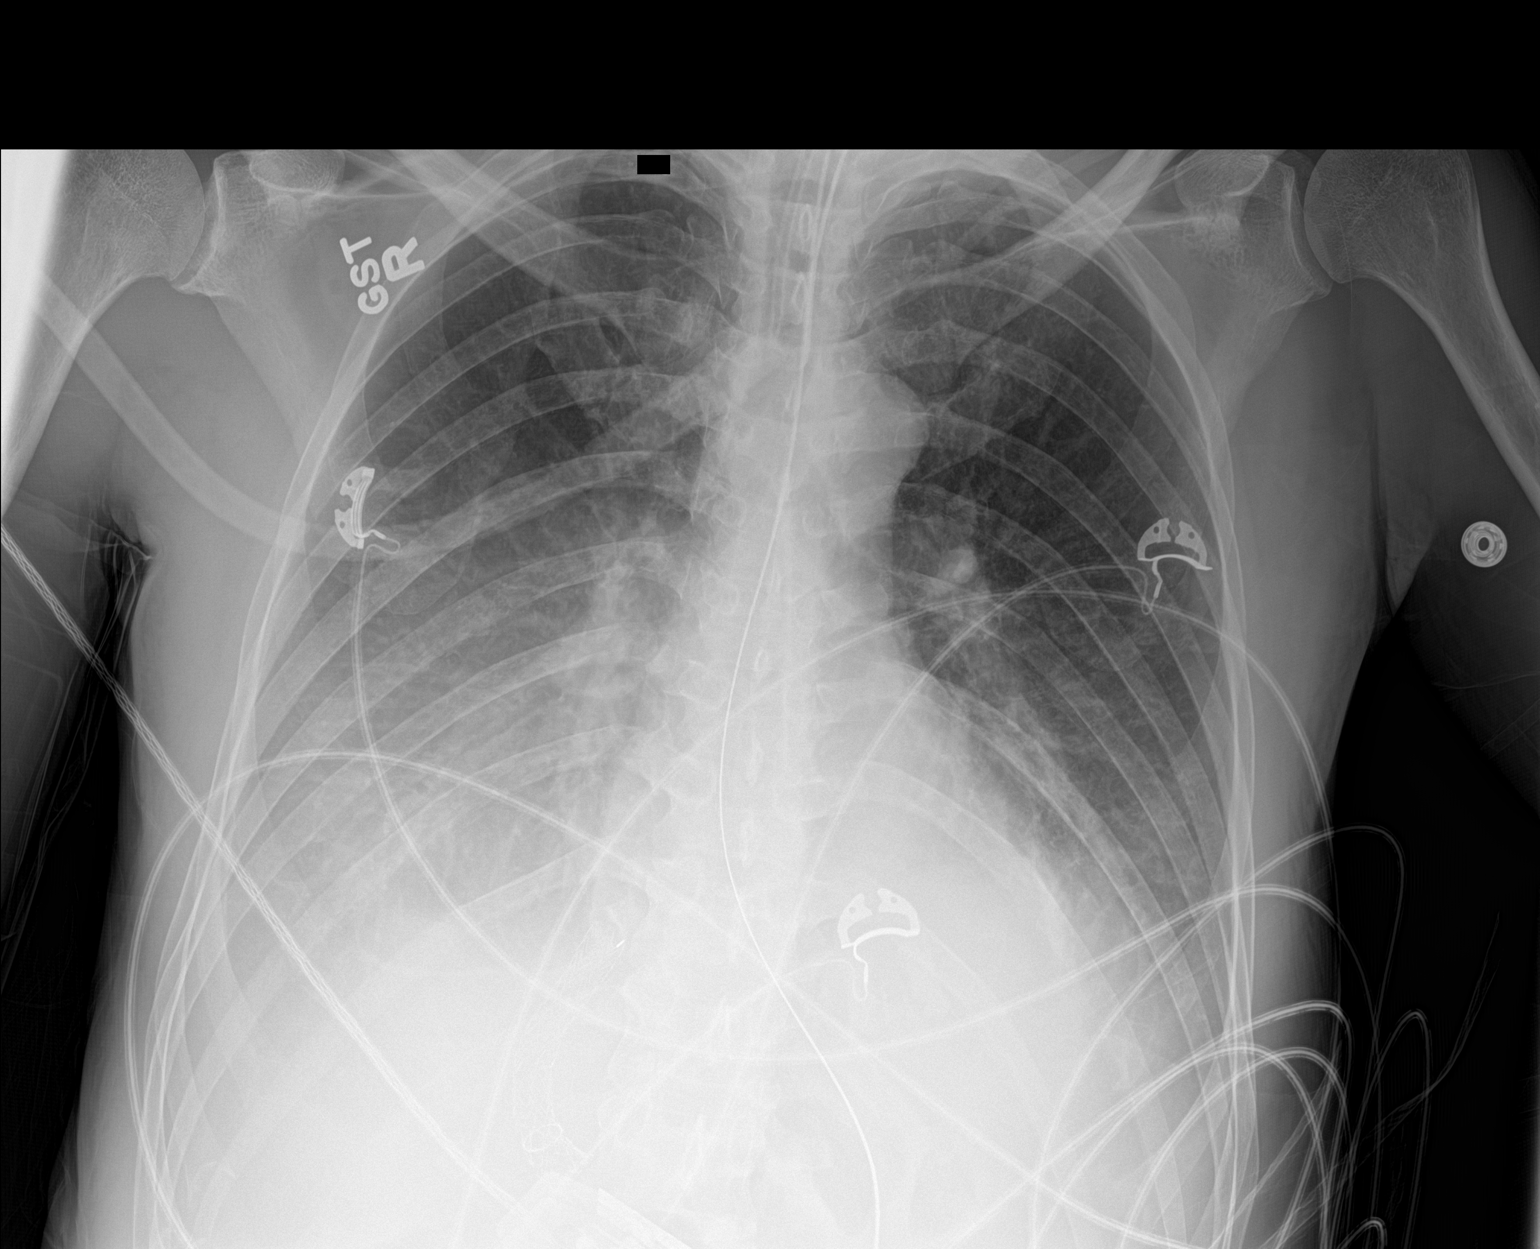

[1 of 1 positions shown; findings below may reference images not displayed]

FINDINGS: Endotracheal tube and NG tube in stable position. Heart size stable.
Persistent unchanged bilateral pulmonary infiltrates/edema and
bilateral pleural effusions. No pneumothorax .
IMPRESSION: 1. Lines and tubes in stable position.

2. Persistent bilateral pulmonary infiltrates/ edema and bilateral
pleural effusions. No significant change.

## 2018-09-22 IMAGING — DX DG CHEST 1V PORT
1 series · 1 of 1 positions shown · non-contrast
Comparison: November 10, 2016

CLINICAL DATA: Hypoxia

EXAM:
PORTABLE CHEST 1 VIEW

[chest ap]
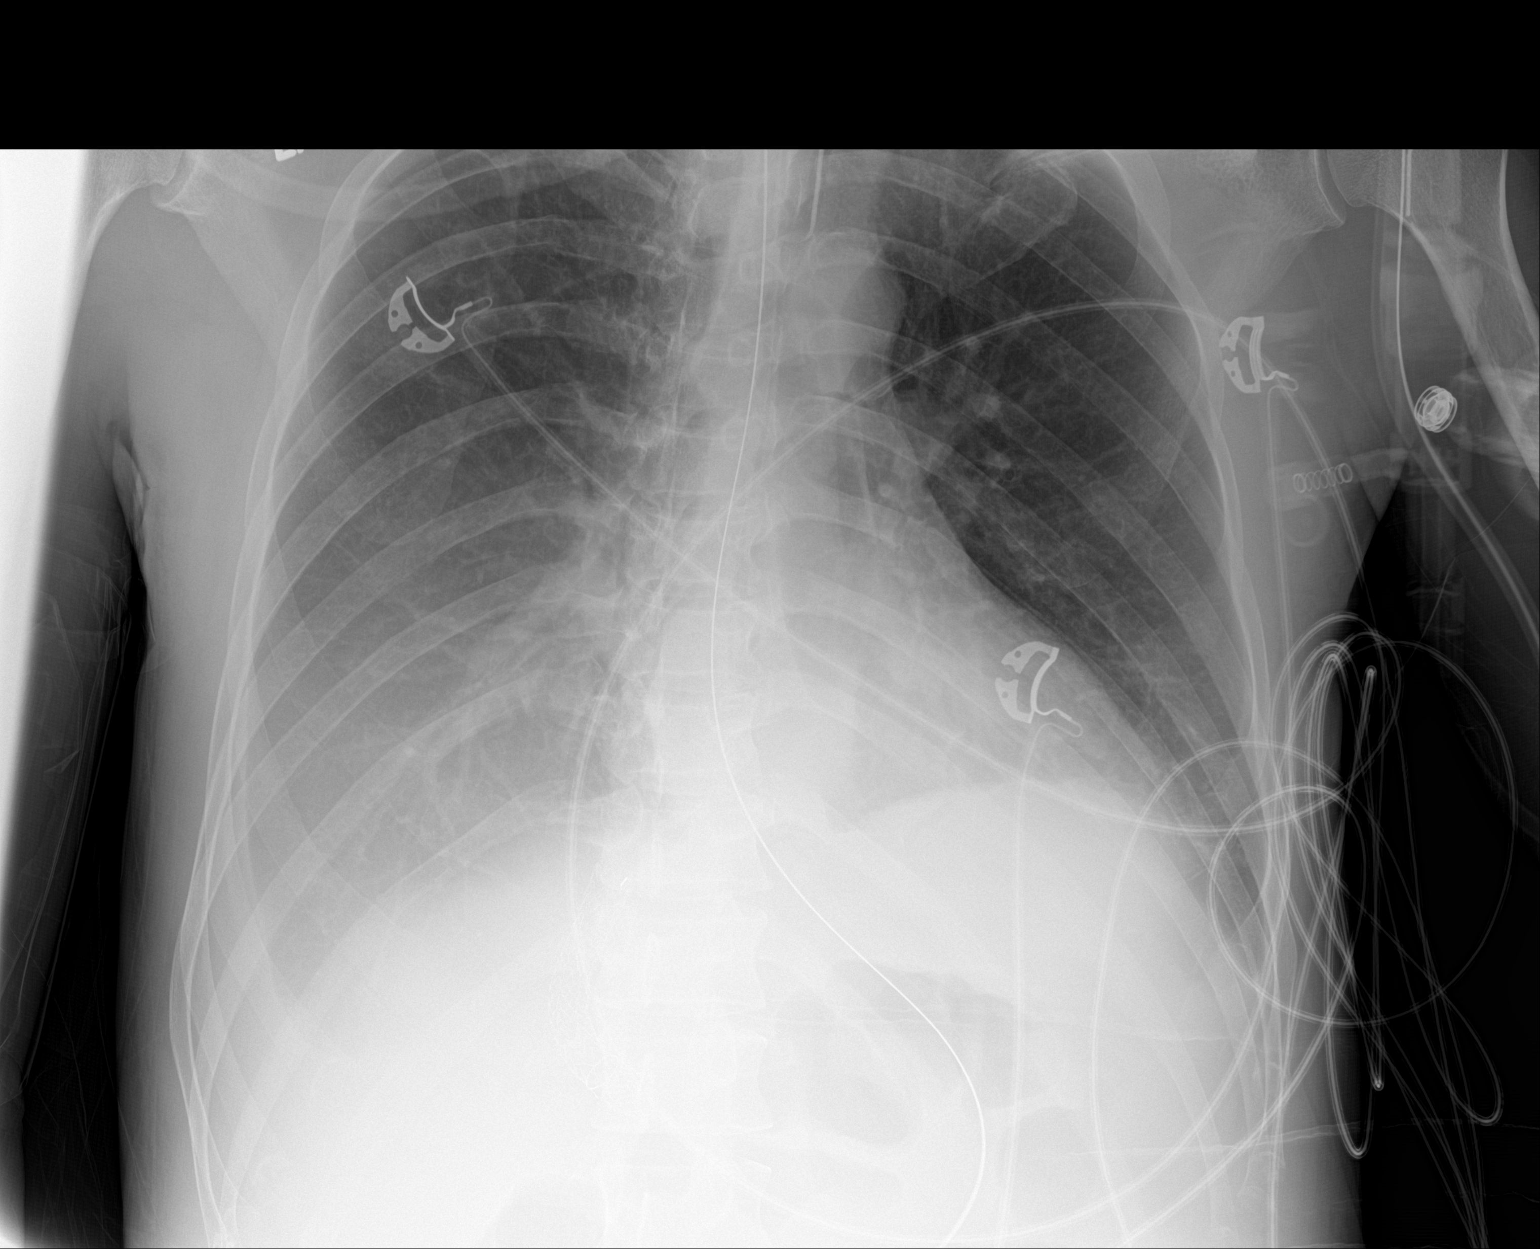

[1 of 1 positions shown; findings below may reference images not displayed]

FINDINGS: Endotracheal tube tip is 4.1 cm above the carina. Nasogastric tube
tip and side port are below the diaphragm. No pneumothorax. There
are pleural effusions bilaterally, larger on the right than the
left. There is no appreciable edema or consolidation. Heart is upper
normal in size with pulmonary vascularity within normal limits. No
evident adenopathy. No bone lesions.
IMPRESSION: Tube positions as described without pneumothorax. Layering effusions
bilaterally, larger on the right than on the left. No edema or
consolidation evident. Stable cardiac silhouette.

## 2018-10-07 DIAGNOSIS — D899 Disorder involving the immune mechanism, unspecified: Secondary | ICD-10-CM | POA: Diagnosis not present

## 2018-10-07 DIAGNOSIS — T8643 Liver transplant infection: Secondary | ICD-10-CM | POA: Diagnosis not present

## 2018-10-07 DIAGNOSIS — Z944 Liver transplant status: Secondary | ICD-10-CM | POA: Diagnosis not present

## 2018-10-07 DIAGNOSIS — Z4823 Encounter for aftercare following liver transplant: Secondary | ICD-10-CM | POA: Diagnosis not present

## 2018-10-12 DIAGNOSIS — Z944 Liver transplant status: Secondary | ICD-10-CM | POA: Diagnosis not present

## 2018-10-12 DIAGNOSIS — D899 Disorder involving the immune mechanism, unspecified: Secondary | ICD-10-CM | POA: Diagnosis not present

## 2018-10-17 DIAGNOSIS — Z944 Liver transplant status: Secondary | ICD-10-CM | POA: Diagnosis not present

## 2018-10-17 DIAGNOSIS — D899 Disorder involving the immune mechanism, unspecified: Secondary | ICD-10-CM | POA: Diagnosis not present

## 2018-10-21 DIAGNOSIS — T451X5A Adverse effect of antineoplastic and immunosuppressive drugs, initial encounter: Secondary | ICD-10-CM | POA: Diagnosis not present

## 2018-10-21 DIAGNOSIS — D702 Other drug-induced agranulocytosis: Secondary | ICD-10-CM | POA: Diagnosis not present

## 2018-10-21 DIAGNOSIS — D899 Disorder involving the immune mechanism, unspecified: Secondary | ICD-10-CM | POA: Diagnosis not present

## 2018-10-21 DIAGNOSIS — Z8619 Personal history of other infectious and parasitic diseases: Secondary | ICD-10-CM | POA: Diagnosis not present

## 2018-10-21 DIAGNOSIS — Z4823 Encounter for aftercare following liver transplant: Secondary | ICD-10-CM | POA: Diagnosis not present

## 2018-10-21 DIAGNOSIS — Z944 Liver transplant status: Secondary | ICD-10-CM | POA: Diagnosis not present

## 2018-10-21 DIAGNOSIS — T8643 Liver transplant infection: Secondary | ICD-10-CM | POA: Diagnosis not present

## 2018-10-21 DIAGNOSIS — Z7952 Long term (current) use of systemic steroids: Secondary | ICD-10-CM | POA: Diagnosis not present

## 2018-10-21 DIAGNOSIS — Z79899 Other long term (current) drug therapy: Secondary | ICD-10-CM | POA: Diagnosis not present

## 2018-10-27 DIAGNOSIS — D899 Disorder involving the immune mechanism, unspecified: Secondary | ICD-10-CM | POA: Diagnosis not present

## 2018-10-27 DIAGNOSIS — Z944 Liver transplant status: Secondary | ICD-10-CM | POA: Diagnosis not present

## 2018-10-28 DIAGNOSIS — E099 Drug or chemical induced diabetes mellitus without complications: Secondary | ICD-10-CM | POA: Diagnosis not present

## 2018-11-07 DIAGNOSIS — D899 Disorder involving the immune mechanism, unspecified: Secondary | ICD-10-CM | POA: Diagnosis not present

## 2018-11-07 DIAGNOSIS — Z944 Liver transplant status: Secondary | ICD-10-CM | POA: Diagnosis not present

## 2018-11-11 DIAGNOSIS — Z9189 Other specified personal risk factors, not elsewhere classified: Secondary | ICD-10-CM | POA: Diagnosis not present

## 2018-11-11 DIAGNOSIS — Z944 Liver transplant status: Secondary | ICD-10-CM | POA: Diagnosis not present

## 2018-11-11 DIAGNOSIS — T8643 Liver transplant infection: Secondary | ICD-10-CM | POA: Diagnosis not present

## 2018-11-11 DIAGNOSIS — B1920 Unspecified viral hepatitis C without hepatic coma: Secondary | ICD-10-CM | POA: Diagnosis not present

## 2018-11-11 DIAGNOSIS — D899 Disorder involving the immune mechanism, unspecified: Secondary | ICD-10-CM | POA: Diagnosis not present

## 2018-11-15 DIAGNOSIS — D899 Disorder involving the immune mechanism, unspecified: Secondary | ICD-10-CM | POA: Diagnosis not present

## 2018-11-15 DIAGNOSIS — Z944 Liver transplant status: Secondary | ICD-10-CM | POA: Diagnosis not present

## 2018-11-15 DIAGNOSIS — Z8505 Personal history of malignant neoplasm of liver: Secondary | ICD-10-CM | POA: Diagnosis not present

## 2018-11-15 DIAGNOSIS — R739 Hyperglycemia, unspecified: Secondary | ICD-10-CM | POA: Diagnosis not present

## 2018-11-15 DIAGNOSIS — B181 Chronic viral hepatitis B without delta-agent: Secondary | ICD-10-CM | POA: Diagnosis not present

## 2018-11-15 DIAGNOSIS — Z4823 Encounter for aftercare following liver transplant: Secondary | ICD-10-CM | POA: Diagnosis not present

## 2018-11-22 DIAGNOSIS — D899 Disorder involving the immune mechanism, unspecified: Secondary | ICD-10-CM | POA: Diagnosis not present

## 2018-11-22 DIAGNOSIS — Z944 Liver transplant status: Secondary | ICD-10-CM | POA: Diagnosis not present

## 2018-12-01 DIAGNOSIS — T380X5A Adverse effect of glucocorticoids and synthetic analogues, initial encounter: Secondary | ICD-10-CM | POA: Diagnosis not present

## 2018-12-01 DIAGNOSIS — R739 Hyperglycemia, unspecified: Secondary | ICD-10-CM | POA: Diagnosis not present

## 2018-12-02 DIAGNOSIS — D899 Disorder involving the immune mechanism, unspecified: Secondary | ICD-10-CM | POA: Diagnosis not present

## 2018-12-02 DIAGNOSIS — Z944 Liver transplant status: Secondary | ICD-10-CM | POA: Diagnosis not present

## 2018-12-06 DIAGNOSIS — Z944 Liver transplant status: Secondary | ICD-10-CM | POA: Diagnosis not present

## 2018-12-06 DIAGNOSIS — D899 Disorder involving the immune mechanism, unspecified: Secondary | ICD-10-CM | POA: Diagnosis not present

## 2018-12-16 DIAGNOSIS — R519 Headache, unspecified: Secondary | ICD-10-CM | POA: Diagnosis not present

## 2018-12-16 DIAGNOSIS — T380X5A Adverse effect of glucocorticoids and synthetic analogues, initial encounter: Secondary | ICD-10-CM | POA: Diagnosis not present

## 2018-12-16 DIAGNOSIS — B1920 Unspecified viral hepatitis C without hepatic coma: Secondary | ICD-10-CM | POA: Diagnosis not present

## 2018-12-16 DIAGNOSIS — R197 Diarrhea, unspecified: Secondary | ICD-10-CM | POA: Diagnosis not present

## 2018-12-16 DIAGNOSIS — Z79899 Other long term (current) drug therapy: Secondary | ICD-10-CM | POA: Diagnosis not present

## 2018-12-16 DIAGNOSIS — Z23 Encounter for immunization: Secondary | ICD-10-CM | POA: Diagnosis not present

## 2018-12-16 DIAGNOSIS — Z944 Liver transplant status: Secondary | ICD-10-CM | POA: Diagnosis not present

## 2018-12-16 DIAGNOSIS — C220 Liver cell carcinoma: Secondary | ICD-10-CM | POA: Diagnosis not present

## 2018-12-16 DIAGNOSIS — Z4823 Encounter for aftercare following liver transplant: Secondary | ICD-10-CM | POA: Diagnosis not present

## 2018-12-16 DIAGNOSIS — R739 Hyperglycemia, unspecified: Secondary | ICD-10-CM | POA: Diagnosis not present

## 2018-12-16 DIAGNOSIS — D849 Immunodeficiency, unspecified: Secondary | ICD-10-CM | POA: Diagnosis not present

## 2018-12-16 DIAGNOSIS — D72819 Decreased white blood cell count, unspecified: Secondary | ICD-10-CM | POA: Diagnosis not present

## 2018-12-21 DIAGNOSIS — Z944 Liver transplant status: Secondary | ICD-10-CM | POA: Diagnosis not present

## 2018-12-21 DIAGNOSIS — D899 Disorder involving the immune mechanism, unspecified: Secondary | ICD-10-CM | POA: Diagnosis not present

## 2018-12-28 DIAGNOSIS — Z944 Liver transplant status: Secondary | ICD-10-CM | POA: Diagnosis not present

## 2018-12-28 DIAGNOSIS — D899 Disorder involving the immune mechanism, unspecified: Secondary | ICD-10-CM | POA: Diagnosis not present

## 2019-01-10 DIAGNOSIS — Z944 Liver transplant status: Secondary | ICD-10-CM | POA: Diagnosis not present

## 2019-01-10 DIAGNOSIS — D899 Disorder involving the immune mechanism, unspecified: Secondary | ICD-10-CM | POA: Diagnosis not present

## 2019-01-24 DIAGNOSIS — Z944 Liver transplant status: Secondary | ICD-10-CM | POA: Diagnosis not present

## 2019-01-24 DIAGNOSIS — D899 Disorder involving the immune mechanism, unspecified: Secondary | ICD-10-CM | POA: Diagnosis not present

## 2019-01-31 DIAGNOSIS — Z944 Liver transplant status: Secondary | ICD-10-CM | POA: Diagnosis not present

## 2019-01-31 DIAGNOSIS — D849 Immunodeficiency, unspecified: Secondary | ICD-10-CM | POA: Diagnosis not present

## 2019-01-31 DIAGNOSIS — T380X5A Adverse effect of glucocorticoids and synthetic analogues, initial encounter: Secondary | ICD-10-CM | POA: Diagnosis not present

## 2019-01-31 DIAGNOSIS — I10 Essential (primary) hypertension: Secondary | ICD-10-CM | POA: Diagnosis not present

## 2019-01-31 DIAGNOSIS — R739 Hyperglycemia, unspecified: Secondary | ICD-10-CM | POA: Diagnosis not present

## 2019-01-31 DIAGNOSIS — C220 Liver cell carcinoma: Secondary | ICD-10-CM | POA: Diagnosis not present

## 2019-01-31 DIAGNOSIS — M8580 Other specified disorders of bone density and structure, unspecified site: Secondary | ICD-10-CM | POA: Diagnosis not present

## 2019-03-14 DIAGNOSIS — Z944 Liver transplant status: Secondary | ICD-10-CM | POA: Diagnosis not present

## 2019-03-14 DIAGNOSIS — D899 Disorder involving the immune mechanism, unspecified: Secondary | ICD-10-CM | POA: Diagnosis not present

## 2019-04-26 DIAGNOSIS — D899 Disorder involving the immune mechanism, unspecified: Secondary | ICD-10-CM | POA: Diagnosis not present

## 2019-04-26 DIAGNOSIS — Z944 Liver transplant status: Secondary | ICD-10-CM | POA: Diagnosis not present

## 2019-05-11 DIAGNOSIS — K7460 Unspecified cirrhosis of liver: Secondary | ICD-10-CM | POA: Diagnosis not present

## 2019-05-11 DIAGNOSIS — I10 Essential (primary) hypertension: Secondary | ICD-10-CM | POA: Diagnosis not present

## 2019-05-11 DIAGNOSIS — R918 Other nonspecific abnormal finding of lung field: Secondary | ICD-10-CM | POA: Diagnosis not present

## 2019-05-11 DIAGNOSIS — C220 Liver cell carcinoma: Secondary | ICD-10-CM | POA: Diagnosis not present

## 2019-05-11 DIAGNOSIS — M8580 Other specified disorders of bone density and structure, unspecified site: Secondary | ICD-10-CM | POA: Diagnosis not present

## 2019-05-11 DIAGNOSIS — Z944 Liver transplant status: Secondary | ICD-10-CM | POA: Diagnosis not present

## 2019-05-11 DIAGNOSIS — D849 Immunodeficiency, unspecified: Secondary | ICD-10-CM | POA: Diagnosis not present

## 2019-05-11 DIAGNOSIS — B181 Chronic viral hepatitis B without delta-agent: Secondary | ICD-10-CM | POA: Diagnosis not present

## 2019-05-15 ENCOUNTER — Ambulatory Visit: Payer: Self-pay | Attending: Internal Medicine

## 2019-05-15 DIAGNOSIS — Z23 Encounter for immunization: Secondary | ICD-10-CM

## 2019-05-15 NOTE — Progress Notes (Signed)
   Covid-19 Vaccination Clinic  Name:  Chad Ramos    MRN: [id number] DOB: 26-Jun-1965  05/15/2019  Chad Ramos was observed post Covid-19 immunization for 15 minutes without incident. He was provided with Vaccine Information Sheet and instruction to access the V-Safe system.   Chad Ramos was instructed to call 911 with any severe reactions post vaccine: Marland Kitchen Difficulty breathing  . Swelling of face and throat  . A fast heartbeat  . A bad rash all over body  . Dizziness and weakness   Immunizations Administered    Name Date Dose VIS Date Route   Pfizer COVID-19 Vaccine 05/15/2019  3:15 PM 0.3 mL 01/27/2019 Intramuscular   Manufacturer: [employer], [employer]   Lot: [id number]   NDC: [address]-0

## 2019-06-07 ENCOUNTER — Ambulatory Visit: Payer: Self-pay | Attending: Internal Medicine

## 2019-06-07 DIAGNOSIS — Z23 Encounter for immunization: Secondary | ICD-10-CM

## 2019-06-07 NOTE — Progress Notes (Signed)
   Covid-19 Vaccination Clinic  Name:  Chad Ramos    MRN: [id number] DOB: 09-14-1965  06/07/2019  Chad Ramos was observed post Covid-19 immunization for 15 minutes without incident. He was provided with Vaccine Information Sheet and instruction to access the V-Safe system.   Chad Ramos was instructed to call 911 with any severe reactions post vaccine: Marland Kitchen Difficulty breathing  . Swelling of face and throat  . A fast heartbeat  . A bad rash all over body  . Dizziness and weakness   Immunizations Administered    Name Date Dose VIS Date Route   Pfizer COVID-19 Vaccine 06/07/2019  2:02 PM 0.3 mL 04/12/2018 Intramuscular   Manufacturer: [employer], [employer]   Lot: [id number]   NDC: [address]-1

## 2019-06-14 DIAGNOSIS — Z944 Liver transplant status: Secondary | ICD-10-CM | POA: Diagnosis not present

## 2019-06-14 DIAGNOSIS — D899 Disorder involving the immune mechanism, unspecified: Secondary | ICD-10-CM | POA: Diagnosis not present

## 2019-08-03 DIAGNOSIS — Z944 Liver transplant status: Secondary | ICD-10-CM | POA: Diagnosis not present

## 2019-08-03 DIAGNOSIS — D899 Disorder involving the immune mechanism, unspecified: Secondary | ICD-10-CM | POA: Diagnosis not present

## 2019-09-27 DIAGNOSIS — Z1159 Encounter for screening for other viral diseases: Secondary | ICD-10-CM | POA: Diagnosis not present

## 2019-09-27 DIAGNOSIS — Z944 Liver transplant status: Secondary | ICD-10-CM | POA: Diagnosis not present

## 2019-09-27 DIAGNOSIS — B181 Chronic viral hepatitis B without delta-agent: Secondary | ICD-10-CM | POA: Diagnosis not present

## 2019-09-27 DIAGNOSIS — K7460 Unspecified cirrhosis of liver: Secondary | ICD-10-CM | POA: Diagnosis not present

## 2019-09-27 DIAGNOSIS — D899 Disorder involving the immune mechanism, unspecified: Secondary | ICD-10-CM | POA: Diagnosis not present

## 2019-09-27 DIAGNOSIS — Z114 Encounter for screening for human immunodeficiency virus [HIV]: Secondary | ICD-10-CM | POA: Diagnosis not present

## 2019-11-16 DIAGNOSIS — D849 Immunodeficiency, unspecified: Secondary | ICD-10-CM | POA: Diagnosis not present

## 2019-11-16 DIAGNOSIS — Z944 Liver transplant status: Secondary | ICD-10-CM | POA: Diagnosis not present

## 2019-11-16 DIAGNOSIS — Z23 Encounter for immunization: Secondary | ICD-10-CM | POA: Diagnosis not present

## 2019-11-16 DIAGNOSIS — C220 Liver cell carcinoma: Secondary | ICD-10-CM | POA: Diagnosis not present

## 2019-12-07 DIAGNOSIS — J479 Bronchiectasis, uncomplicated: Secondary | ICD-10-CM | POA: Diagnosis not present

## 2019-12-07 DIAGNOSIS — C220 Liver cell carcinoma: Secondary | ICD-10-CM | POA: Diagnosis not present

## 2019-12-07 DIAGNOSIS — R918 Other nonspecific abnormal finding of lung field: Secondary | ICD-10-CM | POA: Diagnosis not present

## 2020-01-09 ENCOUNTER — Telehealth: Payer: Self-pay | Admitting: Pharmacy Technician

## 2020-01-24 DIAGNOSIS — Z944 Liver transplant status: Secondary | ICD-10-CM | POA: Diagnosis not present

## 2020-01-24 DIAGNOSIS — D849 Immunodeficiency, unspecified: Secondary | ICD-10-CM | POA: Diagnosis not present

## 2020-04-10 DIAGNOSIS — Z944 Liver transplant status: Secondary | ICD-10-CM | POA: Diagnosis not present

## 2020-04-10 DIAGNOSIS — D849 Immunodeficiency, unspecified: Secondary | ICD-10-CM | POA: Diagnosis not present

## 2020-07-30 DIAGNOSIS — D849 Immunodeficiency, unspecified: Secondary | ICD-10-CM | POA: Diagnosis not present

## 2020-07-30 DIAGNOSIS — Z944 Liver transplant status: Secondary | ICD-10-CM | POA: Diagnosis not present

## 2020-09-16 DIAGNOSIS — C220 Liver cell carcinoma: Secondary | ICD-10-CM | POA: Diagnosis not present

## 2020-09-16 DIAGNOSIS — Z8505 Personal history of malignant neoplasm of liver: Secondary | ICD-10-CM | POA: Diagnosis not present

## 2020-09-16 DIAGNOSIS — Z944 Liver transplant status: Secondary | ICD-10-CM | POA: Diagnosis not present

## 2020-09-16 DIAGNOSIS — Z8639 Personal history of other endocrine, nutritional and metabolic disease: Secondary | ICD-10-CM | POA: Diagnosis not present

## 2020-09-16 DIAGNOSIS — D849 Immunodeficiency, unspecified: Secondary | ICD-10-CM | POA: Diagnosis not present

## 2020-12-16 DIAGNOSIS — D849 Immunodeficiency, unspecified: Secondary | ICD-10-CM | POA: Diagnosis not present

## 2020-12-16 DIAGNOSIS — Z944 Liver transplant status: Secondary | ICD-10-CM | POA: Diagnosis not present

## 2020-12-27 DIAGNOSIS — D696 Thrombocytopenia, unspecified: Secondary | ICD-10-CM | POA: Diagnosis not present

## 2020-12-27 DIAGNOSIS — Z944 Liver transplant status: Secondary | ICD-10-CM | POA: Diagnosis not present

## 2020-12-27 DIAGNOSIS — R7303 Prediabetes: Secondary | ICD-10-CM | POA: Diagnosis not present

## 2020-12-27 DIAGNOSIS — I10 Essential (primary) hypertension: Secondary | ICD-10-CM | POA: Diagnosis not present

## 2021-03-20 DIAGNOSIS — K7460 Unspecified cirrhosis of liver: Secondary | ICD-10-CM | POA: Diagnosis not present

## 2021-03-20 DIAGNOSIS — D849 Immunodeficiency, unspecified: Secondary | ICD-10-CM | POA: Diagnosis not present

## 2021-03-20 DIAGNOSIS — Z5181 Encounter for therapeutic drug level monitoring: Secondary | ICD-10-CM | POA: Diagnosis not present

## 2021-03-20 DIAGNOSIS — Z944 Liver transplant status: Secondary | ICD-10-CM | POA: Diagnosis not present

## 2021-03-20 DIAGNOSIS — I10 Essential (primary) hypertension: Secondary | ICD-10-CM | POA: Diagnosis not present

## 2021-03-20 DIAGNOSIS — Z23 Encounter for immunization: Secondary | ICD-10-CM | POA: Diagnosis not present

## 2021-03-20 DIAGNOSIS — N179 Acute kidney failure, unspecified: Secondary | ICD-10-CM | POA: Diagnosis not present

## 2021-03-20 DIAGNOSIS — B181 Chronic viral hepatitis B without delta-agent: Secondary | ICD-10-CM | POA: Diagnosis not present

## 2021-03-20 DIAGNOSIS — Z79899 Other long term (current) drug therapy: Secondary | ICD-10-CM | POA: Diagnosis not present

## 2021-03-20 DIAGNOSIS — J479 Bronchiectasis, uncomplicated: Secondary | ICD-10-CM | POA: Diagnosis not present

## 2021-03-20 DIAGNOSIS — Z8639 Personal history of other endocrine, nutritional and metabolic disease: Secondary | ICD-10-CM | POA: Diagnosis not present

## 2021-03-20 DIAGNOSIS — Z4823 Encounter for aftercare following liver transplant: Secondary | ICD-10-CM | POA: Diagnosis not present

## 2021-05-22 DIAGNOSIS — Z944 Liver transplant status: Secondary | ICD-10-CM | POA: Diagnosis not present

## 2021-07-31 DIAGNOSIS — Z1159 Encounter for screening for other viral diseases: Secondary | ICD-10-CM | POA: Diagnosis not present

## 2021-07-31 DIAGNOSIS — Z0184 Encounter for antibody response examination: Secondary | ICD-10-CM | POA: Diagnosis not present

## 2021-07-31 DIAGNOSIS — Z0000 Encounter for general adult medical examination without abnormal findings: Secondary | ICD-10-CM | POA: Diagnosis not present

## 2021-07-31 DIAGNOSIS — Z111 Encounter for screening for respiratory tuberculosis: Secondary | ICD-10-CM | POA: Diagnosis not present

## 2021-08-18 DIAGNOSIS — Z944 Liver transplant status: Secondary | ICD-10-CM | POA: Diagnosis not present

## 2021-08-28 DIAGNOSIS — D849 Immunodeficiency, unspecified: Secondary | ICD-10-CM | POA: Diagnosis not present

## 2021-08-28 DIAGNOSIS — K7460 Unspecified cirrhosis of liver: Secondary | ICD-10-CM | POA: Diagnosis not present

## 2021-08-28 DIAGNOSIS — B181 Chronic viral hepatitis B without delta-agent: Secondary | ICD-10-CM | POA: Diagnosis not present

## 2021-08-28 DIAGNOSIS — Z944 Liver transplant status: Secondary | ICD-10-CM | POA: Diagnosis not present

## 2021-09-08 DIAGNOSIS — B181 Chronic viral hepatitis B without delta-agent: Secondary | ICD-10-CM | POA: Diagnosis not present

## 2021-09-08 DIAGNOSIS — K7460 Unspecified cirrhosis of liver: Secondary | ICD-10-CM | POA: Diagnosis not present

## 2021-09-08 DIAGNOSIS — Z944 Liver transplant status: Secondary | ICD-10-CM | POA: Diagnosis not present

## 2021-09-18 DIAGNOSIS — B181 Chronic viral hepatitis B without delta-agent: Secondary | ICD-10-CM | POA: Diagnosis not present

## 2021-09-18 DIAGNOSIS — Z79899 Other long term (current) drug therapy: Secondary | ICD-10-CM | POA: Diagnosis not present

## 2021-09-18 DIAGNOSIS — Z944 Liver transplant status: Secondary | ICD-10-CM | POA: Diagnosis not present

## 2021-09-18 DIAGNOSIS — K7460 Unspecified cirrhosis of liver: Secondary | ICD-10-CM | POA: Diagnosis not present

## 2021-09-18 DIAGNOSIS — D849 Immunodeficiency, unspecified: Secondary | ICD-10-CM | POA: Diagnosis not present

## 2021-12-02 DIAGNOSIS — Z944 Liver transplant status: Secondary | ICD-10-CM | POA: Diagnosis not present

## 2022-04-23 DIAGNOSIS — Z944 Liver transplant status: Secondary | ICD-10-CM | POA: Diagnosis not present

## 2022-04-23 DIAGNOSIS — D849 Immunodeficiency, unspecified: Secondary | ICD-10-CM | POA: Diagnosis not present

## 2022-04-23 DIAGNOSIS — B181 Chronic viral hepatitis B without delta-agent: Secondary | ICD-10-CM | POA: Diagnosis not present

## 2022-04-23 DIAGNOSIS — Z5181 Encounter for therapeutic drug level monitoring: Secondary | ICD-10-CM | POA: Diagnosis not present

## 2022-04-23 DIAGNOSIS — K7460 Unspecified cirrhosis of liver: Secondary | ICD-10-CM | POA: Diagnosis not present

## 2022-10-13 DIAGNOSIS — Z944 Liver transplant status: Secondary | ICD-10-CM | POA: Diagnosis not present

## 2022-10-13 DIAGNOSIS — D849 Immunodeficiency, unspecified: Secondary | ICD-10-CM | POA: Diagnosis not present

## 2022-10-29 DIAGNOSIS — Z944 Liver transplant status: Secondary | ICD-10-CM | POA: Diagnosis not present

## 2022-10-29 DIAGNOSIS — D849 Immunodeficiency, unspecified: Secondary | ICD-10-CM | POA: Diagnosis not present

## 2022-10-29 DIAGNOSIS — R7303 Prediabetes: Secondary | ICD-10-CM | POA: Diagnosis not present

## 2022-10-29 DIAGNOSIS — Z23 Encounter for immunization: Secondary | ICD-10-CM | POA: Diagnosis not present

## 2022-10-29 DIAGNOSIS — D84821 Immunodeficiency due to drugs: Secondary | ICD-10-CM | POA: Diagnosis not present

## 2022-10-29 DIAGNOSIS — Z4823 Encounter for aftercare following liver transplant: Secondary | ICD-10-CM | POA: Diagnosis not present

## 2022-10-29 DIAGNOSIS — I10 Essential (primary) hypertension: Secondary | ICD-10-CM | POA: Diagnosis not present

## 2022-10-29 DIAGNOSIS — Z5181 Encounter for therapeutic drug level monitoring: Secondary | ICD-10-CM | POA: Diagnosis not present

## 2022-10-29 DIAGNOSIS — D696 Thrombocytopenia, unspecified: Secondary | ICD-10-CM | POA: Diagnosis not present

## 2022-10-29 DIAGNOSIS — J479 Bronchiectasis, uncomplicated: Secondary | ICD-10-CM | POA: Diagnosis not present

## 2022-11-10 DIAGNOSIS — D849 Immunodeficiency, unspecified: Secondary | ICD-10-CM | POA: Diagnosis not present

## 2022-11-10 DIAGNOSIS — Z944 Liver transplant status: Secondary | ICD-10-CM | POA: Diagnosis not present

## 2023-02-05 DIAGNOSIS — D849 Immunodeficiency, unspecified: Secondary | ICD-10-CM | POA: Diagnosis not present

## 2023-02-05 DIAGNOSIS — Z944 Liver transplant status: Secondary | ICD-10-CM | POA: Diagnosis not present

## 2023-03-12 DIAGNOSIS — D849 Immunodeficiency, unspecified: Secondary | ICD-10-CM | POA: Diagnosis not present

## 2023-03-12 DIAGNOSIS — Z944 Liver transplant status: Secondary | ICD-10-CM | POA: Diagnosis not present

## 2023-03-24 DIAGNOSIS — Z8619 Personal history of other infectious and parasitic diseases: Secondary | ICD-10-CM | POA: Diagnosis not present

## 2023-03-24 DIAGNOSIS — Z944 Liver transplant status: Secondary | ICD-10-CM | POA: Diagnosis not present

## 2023-03-24 DIAGNOSIS — D849 Immunodeficiency, unspecified: Secondary | ICD-10-CM | POA: Diagnosis not present

## 2023-08-04 DIAGNOSIS — D849 Immunodeficiency, unspecified: Secondary | ICD-10-CM | POA: Diagnosis not present

## 2023-08-04 DIAGNOSIS — Z944 Liver transplant status: Secondary | ICD-10-CM | POA: Diagnosis not present

## 2023-10-13 DIAGNOSIS — Z944 Liver transplant status: Secondary | ICD-10-CM | POA: Diagnosis not present

## 2023-10-13 DIAGNOSIS — D849 Immunodeficiency, unspecified: Secondary | ICD-10-CM | POA: Diagnosis not present

## 2023-10-13 DIAGNOSIS — B181 Chronic viral hepatitis B without delta-agent: Secondary | ICD-10-CM | POA: Diagnosis not present

## 2023-10-13 DIAGNOSIS — K7460 Unspecified cirrhosis of liver: Secondary | ICD-10-CM | POA: Diagnosis not present

## 2023-10-20 DIAGNOSIS — D849 Immunodeficiency, unspecified: Secondary | ICD-10-CM | POA: Diagnosis not present

## 2023-10-20 DIAGNOSIS — Z944 Liver transplant status: Secondary | ICD-10-CM | POA: Diagnosis not present

## 2023-10-20 DIAGNOSIS — K7460 Unspecified cirrhosis of liver: Secondary | ICD-10-CM | POA: Diagnosis not present

## 2023-10-20 DIAGNOSIS — B181 Chronic viral hepatitis B without delta-agent: Secondary | ICD-10-CM | POA: Diagnosis not present

## 2023-12-07 DIAGNOSIS — Z1211 Encounter for screening for malignant neoplasm of colon: Secondary | ICD-10-CM | POA: Diagnosis not present

## 2023-12-07 DIAGNOSIS — Z1212 Encounter for screening for malignant neoplasm of rectum: Secondary | ICD-10-CM | POA: Diagnosis not present

## 2024-01-25 DIAGNOSIS — D849 Immunodeficiency, unspecified: Secondary | ICD-10-CM | POA: Diagnosis not present

## 2024-01-25 DIAGNOSIS — Z944 Liver transplant status: Secondary | ICD-10-CM | POA: Diagnosis not present
